# Patient Record
Sex: Female | Born: 1945
Health system: Southern US, Community
[De-identification: ages and names within clinical notes are randomized; demographics above are authoritative.]

## PROBLEM LIST (undated history)

## (undated) DIAGNOSIS — H3530 Unspecified macular degeneration: Secondary | ICD-10-CM

## (undated) DIAGNOSIS — M549 Dorsalgia, unspecified: Secondary | ICD-10-CM

## (undated) DIAGNOSIS — M109 Gout, unspecified: Secondary | ICD-10-CM

## (undated) DIAGNOSIS — H269 Unspecified cataract: Secondary | ICD-10-CM

## (undated) DIAGNOSIS — I10 Essential (primary) hypertension: Secondary | ICD-10-CM

## (undated) HISTORY — PX: EYE SURGERY: SHX253

## (undated) HISTORY — DX: Unspecified macular degeneration: H35.30

## (undated) HISTORY — DX: Unspecified cataract: H26.9

## (undated) HISTORY — PX: ABDOMINAL HYSTERECTOMY: SHX81

## (undated) HISTORY — PX: CATARACT EXTRACTION: SUR2

---

## 2010-10-28 ENCOUNTER — Emergency Department (INDEPENDENT_AMBULATORY_CARE_PROVIDER_SITE_OTHER): Payer: Self-pay

## 2010-10-28 ENCOUNTER — Emergency Department (HOSPITAL_BASED_OUTPATIENT_CLINIC_OR_DEPARTMENT_OTHER)
Admission: EM | Admit: 2010-10-28 | Discharge: 2010-10-28 | Disposition: A | Discharge status: Home / Self Care | Payer: Self-pay | Attending: Emergency Medicine | Admitting: Emergency Medicine

## 2010-10-28 DIAGNOSIS — M79609 Pain in unspecified limb: Secondary | ICD-10-CM

## 2010-10-28 DIAGNOSIS — I10 Essential (primary) hypertension: Secondary | ICD-10-CM | POA: Insufficient documentation

## 2010-10-28 DIAGNOSIS — I868 Varicose veins of other specified sites: Secondary | ICD-10-CM | POA: Insufficient documentation

## 2011-05-10 ENCOUNTER — Emergency Department (HOSPITAL_BASED_OUTPATIENT_CLINIC_OR_DEPARTMENT_OTHER)
Admission: EM | Admit: 2011-05-10 | Discharge: 2011-05-11 | Disposition: A | Discharge status: Home / Self Care | Payer: Medicare Other | Attending: Emergency Medicine | Admitting: Emergency Medicine

## 2011-05-10 ENCOUNTER — Encounter: Payer: Self-pay | Admitting: Emergency Medicine

## 2011-05-10 ENCOUNTER — Emergency Department (INDEPENDENT_AMBULATORY_CARE_PROVIDER_SITE_OTHER): Payer: Medicare Other

## 2011-05-10 DIAGNOSIS — M549 Dorsalgia, unspecified: Secondary | ICD-10-CM | POA: Insufficient documentation

## 2011-05-10 DIAGNOSIS — R109 Unspecified abdominal pain: Secondary | ICD-10-CM | POA: Insufficient documentation

## 2011-05-10 LAB — URINALYSIS, ROUTINE W REFLEX MICROSCOPIC
Glucose, UA: NEGATIVE mg/dL
Hgb urine dipstick: NEGATIVE
Specific Gravity, Urine: 1.025 (ref 1.005–1.030)
Urobilinogen, UA: 0.2 mg/dL (ref 0.0–1.0)

## 2011-05-10 LAB — URINE MICROSCOPIC-ADD ON

## 2011-05-10 MED ORDER — MORPHINE SULFATE 4 MG/ML IJ SOLN
6.0000 mg | Freq: Once | INTRAMUSCULAR | Status: AC
  Administered 2011-05-10: 4 mg via INTRAVENOUS
  Filled 2011-05-10: qty 1

## 2011-05-10 MED ORDER — MORPHINE SULFATE 2 MG/ML IJ SOLN
INTRAMUSCULAR | Status: AC
  Administered 2011-05-10: 2 mg via INTRAVENOUS
  Filled 2011-05-10: qty 1

## 2011-05-10 MED ORDER — OXYCODONE-ACETAMINOPHEN 5-325 MG PO TABS
2.0000 | ORAL_TABLET | Freq: Once | ORAL | Status: DC
  Filled 2011-05-10: qty 2

## 2011-05-10 MED ORDER — SODIUM CHLORIDE 0.9 % IV BOLUS (SEPSIS)
500.0000 mL | Freq: Once | INTRAVENOUS | Status: AC
  Administered 2011-05-10: 1000 mL via INTRAVENOUS

## 2011-05-10 NOTE — ED Notes (Signed)
Pt c/o lower back pain radiating to hips.

## 2011-05-10 NOTE — ED Provider Notes (Signed)
History     CSN: [id number] Arrival date & time: 05/10/2011 10:37 PM   First MD Initiated Contact with Patient 05/10/11 2300      Chief Complaint  Patient presents with  . Back Pain     The history is provided by the patient and the spouse.   is reports approximately 4 days of intermittent right flank pain with radiation down her right groin.  She denies nausea and vomiting.  She denies dysuria and urinary frequency.  She denies fevers or chills.  She's had no diarrhea.  She does report some mild pain radiating across to the left side of her back as well.  No prior history of kidney stones.  She has no looks really weakness.  She denies numbness and tingling.  Nothing worsens her symptoms.  Nothing improves her symptoms.  She tried ibuprofen without improvement in her symptoms.  Her pain is waxing and waning and at this severe when the pain comes.  Currently her pain is moderate  History reviewed. No pertinent past medical history.  Past Surgical History  Procedure Date  . Abdominal hysterectomy     No family history on file.  History  Substance Use Topics  . Smoking status: Never Smoker   . Smokeless tobacco: Not on file  . Alcohol Use: No    OB History    Grav Para Term Preterm Abortions TAB SAB Ect Mult Living                  Review of Systems  Musculoskeletal: Positive for back pain.  All other systems reviewed and are negative.    Allergies  Penicillins  Home Medications   Current Outpatient Rx  Name Route Sig Dispense Refill  . AMLODIPINE BESYLATE 5 MG PO TABS Oral Take 5 mg by mouth daily.      . IBUPROFEN 200 MG PO TABS Oral Take 400 mg by mouth every 6 (six) hours as needed. For pain     . ONE-DAILY MULTI VITAMINS PO TABS Oral Take 1 tablet by mouth daily.      Marland Kitchen PRESCRIPTION MEDICATION Oral Take 1 tablet by mouth daily as needed. Gout medication       BP 118/78  Pulse 70  Temp(Src) 98.1 F (36.7 C) (Oral)  Resp 18  SpO2 100%  Physical Exam    Nursing note and vitals reviewed. Constitutional: She is oriented to person, place, and time. She appears well-developed and well-nourished. No distress.  HENT:  Head: Normocephalic and atraumatic.  Eyes: EOM are normal.  Neck: Normal range of motion.  Cardiovascular: Normal rate, regular rhythm and normal heart sounds.   Pulmonary/Chest: Effort normal and breath sounds normal.  Abdominal: Soft. She exhibits no distension.       Mild left-sided abdominal tenderness without guarding or rebound.  No rash noted  Genitourinary:       Mild right CVA tenderness  Musculoskeletal: Normal range of motion.  Neurological: She is alert and oriented to person, place, and time.  Skin: Skin is warm and dry.  Psychiatric: She has a normal mood and affect. Judgment normal.    ED Course  Procedures (including critical care time)  Labs Reviewed  URINALYSIS, ROUTINE W REFLEX MICROSCOPIC - Abnormal; Notable for the following:    Appearance CLOUDY (*)    Leukocytes, UA SMALL (*)    All other components within normal limits  URINE MICROSCOPIC-ADD ON - Abnormal; Notable for the following:    Squamous Epithelial / LPF  FEW (*)    Bacteria, UA FEW (*)    All other components within normal limits  CBC - Abnormal; Notable for the following:    Hemoglobin 11.0 (*)    HCT 33.8 (*)    All other components within normal limits  COMPREHENSIVE METABOLIC PANEL - Abnormal; Notable for the following:    Glucose, Bld 106 (*)    Total Bilirubin 0.2 (*)    GFR calc non Af Amer 66 (*)    GFR calc Af Amer 76 (*)    All other components within normal limits   Ct Abdomen Pelvis Wo Contrast  05/11/2011  *RADIOLOGY REPORT*  Clinical Data: Right flank pain.  CT ABDOMEN AND PELVIS WITHOUT CONTRAST  Technique:  Multidetector CT imaging of the abdomen and pelvis was performed following the standard protocol without intravenous contrast.  Comparison: None.  Findings: Minimal bibasilar atelectasis is noted.  The liver and  spleen are unremarkable in appearance.  The gallbladder is within normal limits.  The pancreas and adrenal glands are unremarkable.  The kidneys are grossly unremarkable in appearance.  There is no evidence of hydronephrosis.  No renal or ureteral stones are seen. No perinephric stranding is appreciated.  No free fluid is identified.  The small bowel is unremarkable in appearance.  The stomach is filled with solid material and is within normal limits.  No acute vascular abnormalities are seen. Scattered calcification is noted along the abdominal aorta and its branches.  The appendix is not definitely seen; there is no evidence for appendicitis.  The colon is unremarkable in appearance.  The bladder is decompressed and grossly unremarkable in appearance, though there is prominence of the urethra.  The patient is status post hysterectomy; no suspicious adnexal masses are seen.  No inguinal lymphadenopathy is seen.  No acute osseous abnormalities are identified.  IMPRESSION:  1.  No acute abnormalities identified within the abdomen or pelvis. 2.  Scattered calcification along the abdominal aorta and its branches.  Original Report Authenticated By: Tonia Ghent, M.D.     1. Back pain       MDM  Consideration for right ureteral stone.  She has no lower extremity weakness.  We'll obtain LFTs CBC urinalysis.  CT scan of her abdomen and pelvis pending at this time.  IV fluids and pain medicine.  12:57 AM The patient feels much better at this time. Will dc home with pain medicine. May represent musculoskeletal pain. Urine culture sent. No urinary symptoms       Lyanne Co, MD 05/11/11 0100

## 2011-05-11 DIAGNOSIS — R109 Unspecified abdominal pain: Secondary | ICD-10-CM

## 2011-05-11 LAB — CBC
MCV: 82.6 fL (ref 78.0–100.0)
Platelets: 242 10*3/uL (ref 150–400)
RDW: 14.6 % (ref 11.5–15.5)
WBC: 6.5 10*3/uL (ref 4.0–10.5)

## 2011-05-11 LAB — COMPREHENSIVE METABOLIC PANEL
AST: 14 U/L (ref 0–37)
Albumin: 3.8 g/dL (ref 3.5–5.2)
Chloride: 107 mEq/L (ref 96–112)
Creatinine, Ser: 0.9 mg/dL (ref 0.50–1.10)
Total Bilirubin: 0.2 mg/dL — ABNORMAL LOW (ref 0.3–1.2)
Total Protein: 7.7 g/dL (ref 6.0–8.3)

## 2011-05-11 MED ORDER — OXYCODONE-ACETAMINOPHEN 5-325 MG PO TABS: 1.0000 | ORAL_TABLET | ORAL | Status: AC | PRN

## 2012-03-26 ENCOUNTER — Encounter (HOSPITAL_BASED_OUTPATIENT_CLINIC_OR_DEPARTMENT_OTHER): Payer: Self-pay | Admitting: Emergency Medicine

## 2012-03-26 ENCOUNTER — Emergency Department (HOSPITAL_BASED_OUTPATIENT_CLINIC_OR_DEPARTMENT_OTHER): Payer: Medicare Other

## 2012-03-26 ENCOUNTER — Emergency Department (HOSPITAL_BASED_OUTPATIENT_CLINIC_OR_DEPARTMENT_OTHER)
Admission: EM | Admit: 2012-03-26 | Discharge: 2012-03-27 | Disposition: A | Discharge status: Home / Self Care | Payer: Medicare Other | Attending: Emergency Medicine | Admitting: Emergency Medicine

## 2012-03-26 DIAGNOSIS — M25562 Pain in left knee: Secondary | ICD-10-CM

## 2012-03-26 DIAGNOSIS — Z880 Allergy status to penicillin: Secondary | ICD-10-CM | POA: Insufficient documentation

## 2012-03-26 DIAGNOSIS — M79632 Pain in left forearm: Secondary | ICD-10-CM

## 2012-03-26 DIAGNOSIS — M109 Gout, unspecified: Secondary | ICD-10-CM | POA: Insufficient documentation

## 2012-03-26 DIAGNOSIS — M25539 Pain in unspecified wrist: Secondary | ICD-10-CM | POA: Insufficient documentation

## 2012-03-26 DIAGNOSIS — I10 Essential (primary) hypertension: Secondary | ICD-10-CM | POA: Insufficient documentation

## 2012-03-26 DIAGNOSIS — M25569 Pain in unspecified knee: Secondary | ICD-10-CM | POA: Insufficient documentation

## 2012-03-26 HISTORY — DX: Gout, unspecified: M10.9

## 2012-03-26 HISTORY — DX: Essential (primary) hypertension: I10

## 2012-03-26 MED ORDER — NAPROXEN 250 MG PO TABS
500.0000 mg | ORAL_TABLET | Freq: Once | ORAL | Status: AC
  Administered 2012-03-27: 500 mg via ORAL
  Filled 2012-03-26: qty 2

## 2012-03-26 NOTE — ED Notes (Signed)
Pt sts sharp pain in left arm,knee and leg. Has seen PCP at [facility] for same 6 weeks ago was told to follow up with x rays but did not

## 2012-03-26 NOTE — ED Provider Notes (Signed)
History  This chart was scribed for Hanley Seamen, MD by Erskine Emery. This patient was seen in room MH03/MH03 and the patient's care was started at 23:13.   CSN: [id number]  Arrival date & time 03/26/12  2222   First MD Initiated Contact with Patient 03/26/12 2313      Chief Complaint  Patient presents with  . Extremity Pain    (Consider location/radiation/quality/duration/timing/severity/associated sxs/prior treatment) The history is provided by the patient. No language interpreter was used.  Kerry Barker is a 66 y.o. female who presents to the Emergency Department complaining of left leg and knee pain for the past 6 weeks and left arm pain since this morning. Pt reports a h/o gout that usually flares up in her feet, for which she takes colchicine. Pt saw her PCP with the same complaint; she did not do an x-ray and suggested the pt just continue to take her gout medication, with which the pt has experienced no relief from symptoms.   Past Medical History  Diagnosis Date  . Gout   . Hypertension     Past Surgical History  Procedure Date  . Abdominal hysterectomy     No family history on file.  History  Substance Use Topics  . Smoking status: Never Smoker   . Smokeless tobacco: Not on file  . Alcohol Use: No    OB History    Grav Para Term Preterm Abortions TAB SAB Ect Mult Living                  Review of Systems A complete 10 system review of systems was obtained and all systems are negative except as noted in the HPI and PMH.    Allergies  Penicillins  Home Medications   Current Outpatient Rx  Name Route Sig Dispense Refill  . COLCHICINE 0.6 MG PO TABS Oral Take 0.6 mg by mouth daily.    Marland Kitchen AMLODIPINE BESYLATE 5 MG PO TABS Oral Take 5 mg by mouth daily.      . IBUPROFEN 200 MG PO TABS Oral Take 400 mg by mouth every 6 (six) hours as needed. For pain     . ONE-DAILY MULTI VITAMINS PO TABS Oral Take 1 tablet by mouth daily.      Marland Kitchen PRESCRIPTION MEDICATION  Oral Take 1 tablet by mouth daily as needed. Gout medication       Triage Vitals: BP 133/89  Pulse 84  Temp 98.2 F (36.8 C) (Oral)  Resp 20  Ht 5\' 6"  (1.676 m)  Wt 207 lb (93.895 kg)  BMI 33.41 kg/m2  SpO2 100%  Physical Exam  Nursing note and vitals reviewed. Constitutional: She is oriented to person, place, and time. She appears well-developed and well-nourished. No distress.  HENT:  Head: Normocephalic and atraumatic.  Eyes: EOM are normal. Pupils are equal, round, and reactive to light.       Arcus senilis  Neck: Neck supple. No tracheal deviation present.  Cardiovascular: Normal rate, regular rhythm and normal heart sounds.   Pulmonary/Chest: Effort normal and breath sounds normal. No respiratory distress.  Abdominal: Soft. She exhibits no distension.  Musculoskeletal: Normal range of motion. She exhibits no edema.       Tender to knee, especially to percussion of patella.  Neurological: She is alert and oriented to person, place, and time.  Skin: Skin is warm and dry.       Left forearm soft tissue tenderness without swelling, warmth, or erythema.  Psychiatric: She  has a normal mood and affect.  Addendum: negative left Tinel's test; no erythema, warmth or swelling of left knee  ED Course  Procedures (including critical care time) DIAGNOSTIC STUDIES: Oxygen Saturation is 100% on room air, normal by my interpretation.    COORDINATION OF CARE: 23:15--I evaluated the patient and we discussed a treatment plan including knee x-ray to which the pt agreed.     MDM  Dg Knee Complete 4 Views Left  03/26/2012  *RADIOLOGY REPORT*  Clinical Data: Left knee pain for 6 weeks.  LEFT KNEE - COMPLETE 4+ VIEW  Comparison: None.  Findings: Somewhat high-riding patella which may represent patella alta.  Irregularity of the patellar surface suggest osteochondral lesion which may represent chondromalacia patella.  Consider MRI for further evaluation.  Small left knee effusion.  Mild  degenerative changes in the medial and lateral compartments.  No evidence of acute fracture.  No focal bone lesion or bone destruction is otherwise appreciated.  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  Mild degenerative changes.  No acute fractures appreciated.   Original Report Authenticated By: Marlon Pel, M.D.        I personally performed the services described in this documentation, which was scribed in my presence.  The recorded information has been reviewed and considered.    Hanley Seamen, MD 03/26/12 [phone]

## 2012-03-26 NOTE — ED Notes (Signed)
Patient reports left leg pain for approximately 6 weeks and today started having pain in left arm.  Was seen by MD for leg pain and was tx'd for gout.  She states no relief.

## 2012-03-27 MED ORDER — NAPROXEN SODIUM 275 MG PO TABS: 275.0000 mg | ORAL_TABLET | Freq: Two times a day (BID) | ORAL | Status: DC

## 2012-03-27 MED ORDER — HYDROCODONE-ACETAMINOPHEN 5-325 MG PO TABS: 1.0000 | ORAL_TABLET | Freq: Four times a day (QID) | ORAL | Status: DC | PRN

## 2012-05-02 ENCOUNTER — Encounter (HOSPITAL_BASED_OUTPATIENT_CLINIC_OR_DEPARTMENT_OTHER): Payer: Self-pay | Admitting: Emergency Medicine

## 2012-05-02 ENCOUNTER — Emergency Department (HOSPITAL_BASED_OUTPATIENT_CLINIC_OR_DEPARTMENT_OTHER)
Admission: EM | Admit: 2012-05-02 | Discharge: 2012-05-02 | Disposition: A | Discharge status: Home / Self Care | Payer: Medicare Other | Attending: Emergency Medicine | Admitting: Emergency Medicine

## 2012-05-02 ENCOUNTER — Emergency Department (HOSPITAL_BASED_OUTPATIENT_CLINIC_OR_DEPARTMENT_OTHER): Payer: Medicare Other

## 2012-05-02 DIAGNOSIS — Z79899 Other long term (current) drug therapy: Secondary | ICD-10-CM | POA: Insufficient documentation

## 2012-05-02 DIAGNOSIS — Y939 Activity, unspecified: Secondary | ICD-10-CM | POA: Insufficient documentation

## 2012-05-02 DIAGNOSIS — I10 Essential (primary) hypertension: Secondary | ICD-10-CM | POA: Insufficient documentation

## 2012-05-02 DIAGNOSIS — M109 Gout, unspecified: Secondary | ICD-10-CM | POA: Insufficient documentation

## 2012-05-02 DIAGNOSIS — W2203XA Walked into furniture, initial encounter: Secondary | ICD-10-CM | POA: Insufficient documentation

## 2012-05-02 DIAGNOSIS — Y92009 Unspecified place in unspecified non-institutional (private) residence as the place of occurrence of the external cause: Secondary | ICD-10-CM | POA: Insufficient documentation

## 2012-05-02 DIAGNOSIS — S92919A Unspecified fracture of unspecified toe(s), initial encounter for closed fracture: Secondary | ICD-10-CM | POA: Insufficient documentation

## 2012-05-02 MED ORDER — HYDROCODONE-ACETAMINOPHEN 5-325 MG PO TABS
1.0000 | ORAL_TABLET | Freq: Once | ORAL | Status: AC
  Administered 2012-05-02: 1 via ORAL
  Filled 2012-05-02: qty 1

## 2012-05-02 MED ORDER — HYDROCODONE-ACETAMINOPHEN 5-325 MG PO TABS: 1.0000 | ORAL_TABLET | Freq: Four times a day (QID) | ORAL | Status: DC | PRN

## 2012-05-02 NOTE — ED Provider Notes (Signed)
History     CSN: [id number]  Arrival date & time 05/02/12  2230   None     Chief Complaint  Patient presents with  . Toe Injury    (Consider location/radiation/quality/duration/timing/severity/associated sxs/prior treatment) HPI This is a [age]-year-old female who stubbed her right fourth toe on a piece of furniture at home earlier this evening. There is moderate pain in the right fourth toe, worse with palpation or movement. The toe has a deformity. She denies other injury. There is no numbness.  Past Medical History  Diagnosis Date  . Gout   . Hypertension     Past Surgical History  Procedure Date  . Abdominal hysterectomy     No family history on file.  History  Substance Use Topics  . Smoking status: Never Smoker   . Smokeless tobacco: Not on file  . Alcohol Use: No    OB History    Grav Para Term Preterm Abortions TAB SAB Ect Mult Living                  Review of Systems  All other systems reviewed and are negative.    Allergies  Penicillins and Tramadol  Home Medications   Current Outpatient Rx  Name  Route  Sig  Dispense  Refill  . AMLODIPINE BESYLATE 5 MG PO TABS   Oral   Take 5 mg by mouth daily.           . COLCHICINE 0.6 MG PO TABS   Oral   Take 0.6 mg by mouth daily.         Marland Kitchen HYDROCODONE-ACETAMINOPHEN 5-325 MG PO TABS   Oral   Take 1 tablet by mouth every 6 (six) hours as needed for pain.   20 tablet   0   . HYDROCODONE-ACETAMINOPHEN 5-325 MG PO TABS   Oral   Take 1-2 tablets by mouth every 6 (six) hours as needed for pain.   20 tablet   0   . ONE-DAILY MULTI VITAMINS PO TABS   Oral   Take 1 tablet by mouth daily.           Marland Kitchen NAPROXEN SODIUM 275 MG PO TABS   Oral   Take 1 tablet (275 mg total) by mouth 2 (two) times daily with a meal.   30 tablet   0   . PRESCRIPTION MEDICATION   Oral   Take 1 tablet by mouth daily as needed. Gout medication            BP 122/81  Pulse 78  Temp 98.3 F (36.8 C) (Oral)   Resp 20  Ht 5\' 6"  (1.676 m)  Wt 210 lb (95.255 kg)  BMI 33.89 kg/m2  SpO2 100%  Physical Exam General: Well-developed, well-nourished female in no acute distress; appearance consistent with age of record HENT: normocephalic, atraumatic Eyes: pupils equal round and reactive to light; extraocular muscles intact; arcus senilis bilaterally Neck: supple Heart: regular rate and rhythm Lungs: clear to auscultation bilaterally Abdomen: soft; nondistended Extremities: Varus deformity of right fourth toe with tenderness; toe neurovascularly intact Neurologic: Awake, alert and oriented; motor function intact in all extremities and symmetric; no facial droop Skin: Warm and dry     ED Course  Procedures (including critical care time)  CLOSED REDUCTION The patient's right fourth toe fracture was reduced with gentle traction. The patient tolerated this well and there were no immediate complications. The toe was then buddy taped to the right third toe and  the patient was placed in a postop shoe.   MDM  Dg Toe 4th Right  05/02/2012  *RADIOLOGY REPORT*  Clinical Data: Pain at right fourth toe, hit on chair  RIGHT FOURTH TOE  Comparison: None  Findings: Oblique mildly displaced fracture of the distal aspect of the proximal phalanx right fourth toe. No articular extension. Minimal apex medial angulation of distal fragment. Joint spaces preserved. No additional fracture or dislocation seen.  IMPRESSION: Minimally displaced angulated fracture at the proximal phalanx of the right fourth toe.   Original Report Authenticated By: Ulyses Southward, M.D.            Hanley Seamen, MD 05/02/12 2340

## 2012-05-02 NOTE — ED Notes (Signed)
Pt bumped right 4th toe on furniture at home.  Swelling and deformity.

## 2012-09-18 ENCOUNTER — Emergency Department (HOSPITAL_BASED_OUTPATIENT_CLINIC_OR_DEPARTMENT_OTHER): Payer: Medicare Other

## 2012-09-18 ENCOUNTER — Emergency Department (HOSPITAL_BASED_OUTPATIENT_CLINIC_OR_DEPARTMENT_OTHER)
Admission: EM | Admit: 2012-09-18 | Discharge: 2012-09-18 | Disposition: A | Discharge status: Home / Self Care | Payer: Medicare Other | Attending: Emergency Medicine | Admitting: Emergency Medicine

## 2012-09-18 ENCOUNTER — Encounter (HOSPITAL_BASED_OUTPATIENT_CLINIC_OR_DEPARTMENT_OTHER): Payer: Self-pay

## 2012-09-18 DIAGNOSIS — R109 Unspecified abdominal pain: Secondary | ICD-10-CM | POA: Insufficient documentation

## 2012-09-18 DIAGNOSIS — Z90710 Acquired absence of both cervix and uterus: Secondary | ICD-10-CM | POA: Insufficient documentation

## 2012-09-18 DIAGNOSIS — R1030 Lower abdominal pain, unspecified: Secondary | ICD-10-CM

## 2012-09-18 DIAGNOSIS — N8111 Cystocele, midline: Secondary | ICD-10-CM | POA: Insufficient documentation

## 2012-09-18 DIAGNOSIS — M109 Gout, unspecified: Secondary | ICD-10-CM | POA: Insufficient documentation

## 2012-09-18 DIAGNOSIS — N949 Unspecified condition associated with female genital organs and menstrual cycle: Secondary | ICD-10-CM | POA: Insufficient documentation

## 2012-09-18 DIAGNOSIS — M545 Low back pain, unspecified: Secondary | ICD-10-CM | POA: Insufficient documentation

## 2012-09-18 DIAGNOSIS — Z79899 Other long term (current) drug therapy: Secondary | ICD-10-CM | POA: Insufficient documentation

## 2012-09-18 DIAGNOSIS — I10 Essential (primary) hypertension: Secondary | ICD-10-CM | POA: Insufficient documentation

## 2012-09-18 HISTORY — DX: Dorsalgia, unspecified: M54.9

## 2012-09-18 LAB — CBC WITH DIFFERENTIAL/PLATELET
Eosinophils Relative: 1 % (ref 0–5)
HCT: 35.8 % — ABNORMAL LOW (ref 36.0–46.0)
Hemoglobin: 11.8 g/dL — ABNORMAL LOW (ref 12.0–15.0)
Lymphocytes Relative: 48 % — ABNORMAL HIGH (ref 12–46)
Lymphs Abs: 2.6 10*3/uL (ref 0.7–4.0)
MCV: 83.3 fL (ref 78.0–100.0)
Monocytes Absolute: 0.5 10*3/uL (ref 0.1–1.0)
Monocytes Relative: 9 % (ref 3–12)
Neutro Abs: 2.3 10*3/uL (ref 1.7–7.7)
RBC: 4.3 MIL/uL (ref 3.87–5.11)
WBC: 5.5 10*3/uL (ref 4.0–10.5)

## 2012-09-18 LAB — URINALYSIS, ROUTINE W REFLEX MICROSCOPIC
Glucose, UA: NEGATIVE mg/dL
Hgb urine dipstick: NEGATIVE
Protein, ur: NEGATIVE mg/dL
Specific Gravity, Urine: 1.022 (ref 1.005–1.030)
pH: 5.5 (ref 5.0–8.0)

## 2012-09-18 LAB — BASIC METABOLIC PANEL
CO2: 26 mEq/L (ref 19–32)
Calcium: 9.5 mg/dL (ref 8.4–10.5)
Chloride: 105 mEq/L (ref 96–112)
Creatinine, Ser: 1 mg/dL (ref 0.50–1.10)
Glucose, Bld: 96 mg/dL (ref 70–99)

## 2012-09-18 MED ORDER — SODIUM CHLORIDE 0.9 % IV BOLUS (SEPSIS)
500.0000 mL | Freq: Once | INTRAVENOUS | Status: AC
  Administered 2012-09-18: 500 mL via INTRAVENOUS

## 2012-09-18 MED ORDER — KETOROLAC TROMETHAMINE 30 MG/ML IJ SOLN
30.0000 mg | Freq: Once | INTRAMUSCULAR | Status: AC
  Administered 2012-09-18: 30 mg via INTRAVENOUS
  Filled 2012-09-18: qty 1

## 2012-09-18 NOTE — ED Notes (Signed)
C/o pelvic pain, right flank pain x 1 week

## 2012-09-18 NOTE — ED Provider Notes (Signed)
History     CSN: [id number]  Arrival date & time 09/18/12  8657   First MD Initiated Contact with Patient 09/18/12 1954      Chief Complaint  Patient presents with  . Pelvic Pain  . Flank Pain    (Consider location/radiation/quality/duration/timing/severity/associated sxs/prior treatment) HPI 67 y.o. Female complaining of suprapubic and left flank and low back pain.  Patient seen by nurse after going to bathroom and noted to have mass from vagina which nurse reduced with pressure.  Patient has noted this with pushing.  She states she has had pain for several days.  The pain worsens with urinating and certain movements.  No fever, chills, nausea, vomiting.  Patient has history of chronic constipation.   Past Medical History  Diagnosis Date  . Gout   . Hypertension   . Back pain     Past Surgical History  Procedure Laterality Date  . Abdominal hysterectomy      No family history on file.  History  Substance Use Topics  . Smoking status: Never Smoker   . Smokeless tobacco: Not on file  . Alcohol Use: No    OB History   Grav Para Term Preterm Abortions TAB SAB Ect Mult Living                  Review of Systems  All other systems reviewed and are negative.    Allergies  Penicillins and Tramadol  Home Medications   Current Outpatient Rx  Name  Route  Sig  Dispense  Refill  . amLODipine (NORVASC) 5 MG tablet   Oral   Take 5 mg by mouth daily.           . colchicine 0.6 MG tablet   Oral   Take 0.6 mg by mouth daily.         Marland Kitchen HYDROcodone-acetaminophen (NORCO/VICODIN) 5-325 MG per tablet   Oral   Take 1 tablet by mouth every 6 (six) hours as needed for pain.   20 tablet   0   . HYDROcodone-acetaminophen (NORCO/VICODIN) 5-325 MG per tablet   Oral   Take 1-2 tablets by mouth every 6 (six) hours as needed for pain.   20 tablet   0   . Multiple Vitamin (MULTIVITAMIN) tablet   Oral   Take 1 tablet by mouth daily.           . naproxen sodium  (ANAPROX) 275 MG tablet   Oral   Take 1 tablet (275 mg total) by mouth 2 (two) times daily with a meal.   30 tablet   0   . PRESCRIPTION MEDICATION   Oral   Take 1 tablet by mouth daily as needed. Gout medication            BP 133/82  Pulse 83  Temp(Src) 98.7 F (37.1 C) (Oral)  Resp 12  Ht 5\' 6"  (1.676 m)  Wt 203 lb (92.08 kg)  BMI 32.78 kg/m2  SpO2 100%  Physical Exam  Nursing note and vitals reviewed. Constitutional: She is oriented to person, place, and time. She appears well-developed and well-nourished.  HENT:  Head: Normocephalic and atraumatic.  Right Ear: External ear normal.  Left Ear: External ear normal.  Nose: Nose normal.  Mouth/Throat: Oropharynx is clear and moist.  Eyes: Conjunctivae are normal. Pupils are equal, round, and reactive to light.  Neck: Normal range of motion. Neck supple.  Cardiovascular: Normal rate, regular rhythm, normal heart sounds and intact distal pulses.  Pulmonary/Chest: Effort normal and breath sounds normal.  Abdominal: Soft. Bowel sounds are normal.  Musculoskeletal: Normal range of motion. She exhibits no edema and no tenderness.  Neurological: She is alert and oriented to person, place, and time.  Skin: Skin is warm and dry.  Psychiatric: She has a normal mood and affect. Her behavior is normal. Judgment and thought content normal.    ED Course  Procedures (including critical care time)  Labs Reviewed  CBC WITH DIFFERENTIAL - Abnormal; Notable for the following:    Hemoglobin 11.8 (*)    HCT 35.8 (*)    Neutrophils Relative 41 (*)    Lymphocytes Relative 48 (*)    All other components within normal limits  BASIC METABOLIC PANEL - Abnormal; Notable for the following:    Potassium 3.3 (*)    GFR calc non Af Amer 57 (*)    GFR calc Af Amer 67 (*)    All other components within normal limits  URINALYSIS, ROUTINE W REFLEX MICROSCOPIC   Ct Abdomen Pelvis Wo Contrast  09/18/2012  *RADIOLOGY REPORT*  Clinical Data:  Right flank pain and pelvic pain for 1 week, status post hysterectomy.  CT ABDOMEN AND PELVIS WITHOUT CONTRAST  Technique:  Multidetector CT imaging of the abdomen and pelvis was performed following the standard protocol without intravenous contrast.  Comparison: 05/11/2011 CT abdomen pelvis  Findings: The lung bases are clear, aside from dependent atelectasis in the right lower lobe.  Negative for pleural pericardial effusion.  Negative for urinary tract stones or obstruction.  The noncontrast appearance of the liver, gallbladder, spleen, adrenal glands, kidneys, and pancreas is within normal limits.  Negative for biliary ductal dilatation.  Stomach is moderately distended with food particles.  Small bowel loops and colon are normal in caliber. There are is no ascites or lymphadenopathy.  The abdominal aorta is normal in caliber and there is scattered atherosclerotic calcification.  No acute osseous abnormality.  IMPRESSION:  1.  No acute abnormalities are identified in the abdomen pelvis. 2.  Negative for urinary tract stones or obstruction.   Original Report Authenticated By: Britta Mccreedy, M.D.      No diagnosis found. Results for orders placed during the hospital encounter of 09/18/12  URINALYSIS, ROUTINE W REFLEX MICROSCOPIC      Result Value Range   Color, Urine YELLOW  YELLOW   APPearance CLEAR  CLEAR   Specific Gravity, Urine 1.022  1.005 - 1.030   pH 5.5  5.0 - 8.0   Glucose, UA NEGATIVE  NEGATIVE mg/dL   Hgb urine dipstick NEGATIVE  NEGATIVE   Bilirubin Urine NEGATIVE  NEGATIVE   Ketones, ur NEGATIVE  NEGATIVE mg/dL   Protein, ur NEGATIVE  NEGATIVE mg/dL   Urobilinogen, UA 0.2  0.0 - 1.0 mg/dL   Nitrite NEGATIVE  NEGATIVE   Leukocytes, UA NEGATIVE  NEGATIVE  CBC WITH DIFFERENTIAL      Result Value Range   WBC 5.5  4.0 - 10.5 K/uL   RBC 4.30  3.87 - 5.11 MIL/uL   Hemoglobin 11.8 (*) 12.0 - 15.0 g/dL   HCT 16.1 (*) 09.6 - 04.5 %   MCV 83.3  78.0 - 100.0 fL   MCH 27.4  26.0 - 34.0  pg   MCHC 33.0  30.0 - 36.0 g/dL   RDW 40.9  81.1 - 91.4 %   Platelets 228  150 - 400 K/uL   Neutrophils Relative 41 (*) 43 - 77 %   Neutro Abs 2.3  1.7 -  7.7 K/uL   Lymphocytes Relative 48 (*) 12 - 46 %   Lymphs Abs 2.6  0.7 - 4.0 K/uL   Monocytes Relative 9  3 - 12 %   Monocytes Absolute 0.5  0.1 - 1.0 K/uL   Eosinophils Relative 1  0 - 5 %   Eosinophils Absolute 0.1  0.0 - 0.7 K/uL   Basophils Relative 0  0 - 1 %   Basophils Absolute 0.0  0.0 - 0.1 K/uL  BASIC METABOLIC PANEL      Result Value Range   Sodium 142  135 - 145 mEq/L   Potassium 3.3 (*) 3.5 - 5.1 mEq/L   Chloride 105  96 - 112 mEq/L   CO2 26  19 - 32 mEq/L   Glucose, Bld 96  70 - 99 mg/dL   BUN 18  6 - 23 mg/dL   Creatinine, Ser 1.61  0.50 - 1.10 mg/dL   Calcium 9.5  8.4 - 09.6 mg/dL   GFR calc non Af Amer 57 (*) >90 mL/min   GFR calc Af Amer 67 (*) >90 mL/min      MDM  Patient with clinical description of prolapsed bladder by nurse and patient.  Reduced on my exam.  NO uti or obvious source of pain.  She is slightly tender in suprapubic area and on bimanual over bladder.    Pain improved with toradol.  Plan pain control and follow up with urology.       Hilario Quarry, MD 09/18/12 [phone]

## 2012-09-18 NOTE — ED Notes (Signed)
MD at bedside giving test results and plan of care for DC. 

## 2013-02-20 ENCOUNTER — Emergency Department (HOSPITAL_BASED_OUTPATIENT_CLINIC_OR_DEPARTMENT_OTHER)
Admission: EM | Admit: 2013-02-20 | Discharge: 2013-02-21 | Disposition: A | Discharge status: Home / Self Care | Payer: Medicare Other | Attending: Emergency Medicine | Admitting: Emergency Medicine

## 2013-02-20 ENCOUNTER — Encounter (HOSPITAL_BASED_OUTPATIENT_CLINIC_OR_DEPARTMENT_OTHER): Payer: Self-pay | Admitting: Emergency Medicine

## 2013-02-20 DIAGNOSIS — Z791 Long term (current) use of non-steroidal anti-inflammatories (NSAID): Secondary | ICD-10-CM | POA: Insufficient documentation

## 2013-02-20 DIAGNOSIS — Y929 Unspecified place or not applicable: Secondary | ICD-10-CM | POA: Insufficient documentation

## 2013-02-20 DIAGNOSIS — Z79899 Other long term (current) drug therapy: Secondary | ICD-10-CM | POA: Insufficient documentation

## 2013-02-20 DIAGNOSIS — I10 Essential (primary) hypertension: Secondary | ICD-10-CM | POA: Insufficient documentation

## 2013-02-20 DIAGNOSIS — Z880 Allergy status to penicillin: Secondary | ICD-10-CM | POA: Insufficient documentation

## 2013-02-20 DIAGNOSIS — S39012A Strain of muscle, fascia and tendon of lower back, initial encounter: Secondary | ICD-10-CM

## 2013-02-20 DIAGNOSIS — M109 Gout, unspecified: Secondary | ICD-10-CM | POA: Insufficient documentation

## 2013-02-20 DIAGNOSIS — X58XXXA Exposure to other specified factors, initial encounter: Secondary | ICD-10-CM | POA: Insufficient documentation

## 2013-02-20 DIAGNOSIS — N390 Urinary tract infection, site not specified: Secondary | ICD-10-CM | POA: Insufficient documentation

## 2013-02-20 DIAGNOSIS — S335XXA Sprain of ligaments of lumbar spine, initial encounter: Secondary | ICD-10-CM | POA: Insufficient documentation

## 2013-02-20 DIAGNOSIS — Y939 Activity, unspecified: Secondary | ICD-10-CM | POA: Insufficient documentation

## 2013-02-20 LAB — URINALYSIS, ROUTINE W REFLEX MICROSCOPIC
Glucose, UA: NEGATIVE mg/dL
Hgb urine dipstick: NEGATIVE
Ketones, ur: NEGATIVE mg/dL
Protein, ur: NEGATIVE mg/dL
pH: 5 (ref 5.0–8.0)

## 2013-02-20 NOTE — ED Provider Notes (Signed)
CSN: [id number]     Arrival date & time 02/20/13  2255 History  This chart was scribed for Anajulia Leyendecker Smitty Cords, MD by Blanchard Kelch, ED Scribe. The patient was seen in room MH09/MH09. Patient's care was started at 11:49 PM.    Chief Complaint  Patient presents with  . Back Pain  . Urinary Tract Infection    Patient is a 67 y.o. female presenting with back pain. The history is provided by the patient. No language interpreter was used.  Back Pain Location:  Lumbar spine Quality:  Aching Radiates to:  Does not radiate Pain severity:  Moderate Pain is:  Same all the time Onset quality:  Sudden Duration:  2 weeks Timing:  Constant Progression:  Unchanged Chronicity:  New Context: not falling   Relieved by:  Nothing Worsened by:  Nothing tried Ineffective treatments:  None tried (tylenol) Associated symptoms: no bladder incontinence, no bowel incontinence, no dysuria, no fever, no leg pain, no numbness, no paresthesias, no pelvic pain, no perianal numbness, no tingling and no weakness   Risk factors: no steroid use     HPI Comments: Kerry Barker is a 67 y.o. female who presents to the Emergency Department complaining of constant, unchanged back pain that began two weeks ago. She was told she had a UTI when seen by her PMD and started on antibiotics.  She has been taking Tylenol for the pain without relief.    Her PCP is at [facility] in [place]    Past Medical History  Diagnosis Date  . Gout   . Hypertension   . Back pain    Past Surgical History  Procedure Laterality Date  . Abdominal hysterectomy     No family history on file. History  Substance Use Topics  . Smoking status: Never Smoker   . Smokeless tobacco: Not on file  . Alcohol Use: No   OB History   Grav Para Term Preterm Abortions TAB SAB Ect Mult Living                 Review of Systems  Constitutional: Negative for fever.  Gastrointestinal: Negative for bowel incontinence.  Genitourinary:  Negative for bladder incontinence, dysuria and pelvic pain.  Musculoskeletal: Positive for back pain.  Neurological: Negative for tingling, weakness, numbness and paresthesias.  All other systems reviewed and are negative.    Allergies  Penicillins and Tramadol  Home Medications   Current Outpatient Rx  Name  Route  Sig  Dispense  Refill  . amLODipine (NORVASC) 5 MG tablet   Oral   Take 5 mg by mouth daily.           . colchicine 0.6 MG tablet   Oral   Take 0.6 mg by mouth as needed.          . doxycycline (VIBRAMYCIN) 100 MG capsule   Oral   Take 100 mg by mouth 2 (two) times daily.         . Multiple Vitamin (MULTIVITAMIN) tablet   Oral   Take 1 tablet by mouth daily.           Marland Kitchen HYDROcodone-acetaminophen (NORCO/VICODIN) 5-325 MG per tablet   Oral   Take 1 tablet by mouth every 6 (six) hours as needed for pain.   20 tablet   0   . HYDROcodone-acetaminophen (NORCO/VICODIN) 5-325 MG per tablet   Oral   Take 1-2 tablets by mouth every 6 (six) hours as needed for pain.   20  tablet   0   . naproxen sodium (ANAPROX) 275 MG tablet   Oral   Take 1 tablet (275 mg total) by mouth 2 (two) times daily with a meal.   30 tablet   0   . PRESCRIPTION MEDICATION   Oral   Take 1 tablet by mouth daily as needed. Gout medication           Triage Vitals: BP 149/91  Pulse 78  Temp(Src) 99 F (37.2 C) (Oral)  Resp 18  Ht 5\' 6"  (1.676 m)  Wt 210 lb (95.255 kg)  BMI 33.91 kg/m2  SpO2 100%  Physical Exam  Nursing note and vitals reviewed. Constitutional: She is oriented to person, place, and time. She appears well-developed and well-nourished. No distress.  HENT:  Head: Normocephalic and atraumatic.  Mouth/Throat: Oropharynx is clear and moist. No oropharyngeal exudate.  Eyes: Pupils are equal, round, and reactive to light.  Neck: Normal range of motion. Neck supple.  Cardiovascular: Normal rate, regular rhythm and intact distal pulses.   Intact peripheral  pulses  Pulmonary/Chest: Effort normal and breath sounds normal. She has no wheezes. She has no rales.  Abdominal: Soft. Bowel sounds are normal. There is no tenderness. There is no rebound and no guarding.  Genitourinary:  Pelvis stable  Musculoskeletal: Normal range of motion. She exhibits no edema.  Neurological: She is alert and oriented to person, place, and time. She has normal reflexes.  Skin: Skin is warm.  Psychiatric: She has a normal mood and affect.    ED Course  Procedures (including critical care time)  DIAGNOSTIC STUDIES: Oxygen Saturation is 100% on room air, normal by my interpretation.    COORDINATION OF CARE:  11:53 PM - Patient verbalizes understanding and agrees with treatment plan.    Labs Review Labs Reviewed  URINALYSIS, ROUTINE W REFLEX MICROSCOPIC   Imaging Review No results found.  MDM  No diagnosis found. Lumbar strain, will treat with NSAIDS follow up with your regular doctor for ongoing care  I personally performed the services described in this documentation, which was scribed in my presence. The recorded information has been reviewed and is accurate.    Jasmine Awe, MD 02/21/13 [phone]

## 2013-02-20 NOTE — ED Notes (Signed)
MD at bedside. 

## 2013-02-20 NOTE — ED Notes (Signed)
Pt also reports urinary frequency. 

## 2013-02-20 NOTE — ED Notes (Signed)
Pt c/o lower back pain x 2 weeks ago. Pt was seen by PMD and dx with UTI. Pt has only 1 dose of abx left to take with no improvement in pain.

## 2013-02-21 ENCOUNTER — Emergency Department (HOSPITAL_BASED_OUTPATIENT_CLINIC_OR_DEPARTMENT_OTHER): Payer: Medicare Other

## 2013-02-21 MED ORDER — IBUPROFEN 800 MG PO TABS
800.0000 mg | ORAL_TABLET | Freq: Once | ORAL | Status: AC
  Administered 2013-02-21: 800 mg via ORAL
  Filled 2013-02-21: qty 1

## 2013-02-21 MED ORDER — NAPROXEN 500 MG PO TABS: 500.0000 mg | ORAL_TABLET | Freq: Two times a day (BID) | ORAL | Status: DC

## 2013-02-21 NOTE — ED Notes (Signed)
Pt returned from xray

## 2013-02-21 NOTE — ED Notes (Signed)
Pt requesting pain medication. MD made aware. 

## 2013-06-09 ENCOUNTER — Encounter (HOSPITAL_BASED_OUTPATIENT_CLINIC_OR_DEPARTMENT_OTHER): Payer: Self-pay | Admitting: Emergency Medicine

## 2013-06-09 ENCOUNTER — Emergency Department (HOSPITAL_BASED_OUTPATIENT_CLINIC_OR_DEPARTMENT_OTHER)
Admission: EM | Admit: 2013-06-09 | Discharge: 2013-06-10 | Disposition: A | Discharge status: Home / Self Care | Payer: Medicare Other | Attending: Emergency Medicine | Admitting: Emergency Medicine

## 2013-06-09 ENCOUNTER — Emergency Department (HOSPITAL_BASED_OUTPATIENT_CLINIC_OR_DEPARTMENT_OTHER): Payer: Medicare Other

## 2013-06-09 DIAGNOSIS — Z880 Allergy status to penicillin: Secondary | ICD-10-CM | POA: Insufficient documentation

## 2013-06-09 DIAGNOSIS — Z792 Long term (current) use of antibiotics: Secondary | ICD-10-CM | POA: Insufficient documentation

## 2013-06-09 DIAGNOSIS — B9789 Other viral agents as the cause of diseases classified elsewhere: Secondary | ICD-10-CM | POA: Insufficient documentation

## 2013-06-09 DIAGNOSIS — I10 Essential (primary) hypertension: Secondary | ICD-10-CM | POA: Insufficient documentation

## 2013-06-09 DIAGNOSIS — R52 Pain, unspecified: Secondary | ICD-10-CM | POA: Insufficient documentation

## 2013-06-09 DIAGNOSIS — Z79899 Other long term (current) drug therapy: Secondary | ICD-10-CM | POA: Insufficient documentation

## 2013-06-09 DIAGNOSIS — Z791 Long term (current) use of non-steroidal anti-inflammatories (NSAID): Secondary | ICD-10-CM | POA: Insufficient documentation

## 2013-06-09 DIAGNOSIS — B349 Viral infection, unspecified: Secondary | ICD-10-CM

## 2013-06-09 DIAGNOSIS — M109 Gout, unspecified: Secondary | ICD-10-CM | POA: Insufficient documentation

## 2013-06-09 MED ORDER — ACETAMINOPHEN 325 MG PO TABS
650.0000 mg | ORAL_TABLET | Freq: Once | ORAL | Status: AC
  Administered 2013-06-09: 650 mg via ORAL
  Filled 2013-06-09: qty 2

## 2013-06-09 NOTE — ED Notes (Signed)
Patient having cough, congestion, fever, general body aches

## 2013-06-09 NOTE — ED Provider Notes (Signed)
CSN: [id number]     Arrival date & time 06/09/13  2029 History  This chart was scribed for Kerry Barker Smitty Cords, MD by Quintella Reichert, ED scribe.  This patient was seen in room MH05/MH05 and the patient's care was started at 11:53 PM.   Chief Complaint  Patient presents with  . Influenza    Patient is a 67 y.o. female presenting with cough. The history is provided by the patient. No language interpreter was used.  Cough Cough characteristics:  Non-productive Severity:  Moderate Onset quality:  Gradual Duration:  3 days Timing:  Intermittent Progression:  Unchanged Chronicity:  New Smoker: no   Context: sick contacts   Relieved by:  Nothing Worsened by:  Nothing tried Ineffective treatments:  None tried Associated symptoms: sinus congestion   Associated symptoms: no fever, no rash, no rhinorrhea and no shortness of breath   Risk factors: no recent travel     HPI Comments: Kerry Barker is a 67 y.o. female who presents to the Emergency Department complaining of 3 days of persistent symptoms.  Pt states she has had cough, congestion, generalized body aches.     Past Medical History  Diagnosis Date  . Gout   . Hypertension   . Back pain     Past Surgical History  Procedure Laterality Date  . Abdominal hysterectomy      No family history on file.   History  Substance Use Topics  . Smoking status: Never Smoker   . Smokeless tobacco: Not on file  . Alcohol Use: No    OB History   Grav Para Term Preterm Abortions TAB SAB Ect Mult Living                  Review of Systems  Constitutional: Negative for fever.  HENT: Positive for congestion. Negative for rhinorrhea.   Respiratory: Positive for cough. Negative for shortness of breath.   Skin: Negative for rash.  All other systems reviewed and are negative.     Allergies  Penicillins and Tramadol  Home Medications   Current Outpatient Rx  Name  Route  Sig  Dispense  Refill  . amLODipine (NORVASC) 10 MG  tablet   Oral   Take 10 mg by mouth daily.         Marland Kitchen amLODipine (NORVASC) 5 MG tablet   Oral   Take 5 mg by mouth daily.           . colchicine 0.6 MG tablet   Oral   Take 0.6 mg by mouth as needed.          . doxycycline (VIBRAMYCIN) 100 MG capsule   Oral   Take 100 mg by mouth 2 (two) times daily.         Marland Kitchen HYDROcodone-acetaminophen (NORCO/VICODIN) 5-325 MG per tablet   Oral   Take 1 tablet by mouth every 6 (six) hours as needed for pain.   20 tablet   0   . HYDROcodone-acetaminophen (NORCO/VICODIN) 5-325 MG per tablet   Oral   Take 1-2 tablets by mouth every 6 (six) hours as needed for pain.   20 tablet   0   . Multiple Vitamin (MULTIVITAMIN) tablet   Oral   Take 1 tablet by mouth daily.           . naproxen (NAPROSYN) 500 MG tablet   Oral   Take 1 tablet (500 mg total) by mouth 2 (two) times daily with a meal.   30 tablet  0   . naproxen sodium (ANAPROX) 275 MG tablet   Oral   Take 1 tablet (275 mg total) by mouth 2 (two) times daily with a meal.   30 tablet   0   . PRESCRIPTION MEDICATION   Oral   Take 1 tablet by mouth daily as needed. Gout medication           BP 122/85  Pulse 101  Temp(Src) 100.4 F (38 C) (Oral)  Resp 20  Ht 5\' 6"  (1.676 m)  Wt 212 lb (96.163 kg)  BMI 34.23 kg/m2  SpO2 100%  Physical Exam  Nursing note and vitals reviewed. Constitutional: She is oriented to person, place, and time. She appears well-developed and well-nourished. No distress.  HENT:  Head: Normocephalic and atraumatic.  Mouth/Throat: Oropharynx is clear and moist and mucous membranes are normal. No oropharyngeal exudate, posterior oropharyngeal edema or posterior oropharyngeal erythema.  Eyes: Conjunctivae and EOM are normal. Pupils are equal, round, and reactive to light.  Neck: Normal range of motion. Neck supple. No tracheal deviation present.  Cardiovascular: Normal rate and regular rhythm.   Pulmonary/Chest: Effort normal and breath sounds  normal. No respiratory distress. She has no wheezes. She has no rales.  Abdominal: Soft. Bowel sounds are normal. There is no tenderness. There is no rebound and no guarding.  Musculoskeletal: Normal range of motion.  Lymphadenopathy:    She has no cervical adenopathy.  Neurological: She is alert and oriented to person, place, and time.  Skin: Skin is warm and dry.  Psychiatric: She has a normal mood and affect. Her behavior is normal.    ED Course  Procedures (including critical care time)  DIAGNOSTIC STUDIES: Oxygen Saturation is 100% on room air, normal by my interpretation.    COORDINATION OF CARE: 11:58 PM: Informed pt that symptoms are likely due to influenza and will resolve on their own.  Discussed treatment plan which includes symptomatic relief.  Advised pt to stay hydrated and alternate Tylenol and ibuprofen.  Pt expressed understanding and agreed to plan.   Labs Review Labs Reviewed - No data to display  Imaging Review Dg Chest 2 View  06/09/2013   CLINICAL DATA:  Cough, congestion, fever, and body aches.  EXAM: CHEST  2 VIEW  COMPARISON:  None.  FINDINGS: The heart size and mediastinal contours are within normal limits. Both lungs are clear. The visualized skeletal structures are unremarkable.  IMPRESSION: No active cardiopulmonary disease.   Electronically Signed   By: Burman Nieves M.D.   On: 06/09/2013 21:12    EKG Interpretation   None       MDM  No diagnosis found. Alternate tylenol and ibuprofen, lots of liquids.      I personally performed the services described in this documentation, which was scribed in my presence. The recorded information has been reviewed and is accurate.     Jasmine Awe, MD 06/10/13 [phone]

## 2013-06-10 MED ORDER — IBUPROFEN 600 MG PO TABS: 600.0000 mg | ORAL_TABLET | Freq: Four times a day (QID) | ORAL | Status: AC | PRN

## 2013-06-10 MED ORDER — KETOROLAC TROMETHAMINE 60 MG/2ML IM SOLN
60.0000 mg | Freq: Once | INTRAMUSCULAR | Status: AC
  Administered 2013-06-10: 60 mg via INTRAMUSCULAR
  Filled 2013-06-10: qty 2

## 2013-10-20 ENCOUNTER — Emergency Department (HOSPITAL_BASED_OUTPATIENT_CLINIC_OR_DEPARTMENT_OTHER)
Admission: EM | Admit: 2013-10-20 | Discharge: 2013-10-20 | Disposition: A | Discharge status: Home / Self Care | Payer: Medicare Other | Attending: Emergency Medicine | Admitting: Emergency Medicine

## 2013-10-20 ENCOUNTER — Encounter (HOSPITAL_BASED_OUTPATIENT_CLINIC_OR_DEPARTMENT_OTHER): Payer: Self-pay | Admitting: Emergency Medicine

## 2013-10-20 DIAGNOSIS — M545 Low back pain, unspecified: Secondary | ICD-10-CM

## 2013-10-20 DIAGNOSIS — M109 Gout, unspecified: Secondary | ICD-10-CM | POA: Insufficient documentation

## 2013-10-20 DIAGNOSIS — I10 Essential (primary) hypertension: Secondary | ICD-10-CM | POA: Insufficient documentation

## 2013-10-20 DIAGNOSIS — Z880 Allergy status to penicillin: Secondary | ICD-10-CM | POA: Insufficient documentation

## 2013-10-20 DIAGNOSIS — Z79899 Other long term (current) drug therapy: Secondary | ICD-10-CM | POA: Insufficient documentation

## 2013-10-20 DIAGNOSIS — N760 Acute vaginitis: Secondary | ICD-10-CM | POA: Insufficient documentation

## 2013-10-20 DIAGNOSIS — A499 Bacterial infection, unspecified: Secondary | ICD-10-CM | POA: Insufficient documentation

## 2013-10-20 DIAGNOSIS — N8110 Cystocele, unspecified: Secondary | ICD-10-CM | POA: Diagnosis present

## 2013-10-20 DIAGNOSIS — B9689 Other specified bacterial agents as the cause of diseases classified elsewhere: Secondary | ICD-10-CM | POA: Insufficient documentation

## 2013-10-20 DIAGNOSIS — N8111 Cystocele, midline: Secondary | ICD-10-CM | POA: Insufficient documentation

## 2013-10-20 DIAGNOSIS — Z791 Long term (current) use of non-steroidal anti-inflammatories (NSAID): Secondary | ICD-10-CM | POA: Insufficient documentation

## 2013-10-20 LAB — COMPREHENSIVE METABOLIC PANEL
ALBUMIN: 3.9 g/dL (ref 3.5–5.2)
ALT: 13 U/L (ref 0–35)
AST: 15 U/L (ref 0–37)
Alkaline Phosphatase: 98 U/L (ref 39–117)
BUN: 13 mg/dL (ref 6–23)
CALCIUM: 9.6 mg/dL (ref 8.4–10.5)
CHLORIDE: 106 meq/L (ref 96–112)
CO2: 24 mEq/L (ref 19–32)
CREATININE: 0.8 mg/dL (ref 0.50–1.10)
GFR calc Af Amer: 86 mL/min — ABNORMAL LOW (ref >90)
GFR calc non Af Amer: 75 mL/min — ABNORMAL LOW (ref >90)
Glucose, Bld: 100 mg/dL — ABNORMAL HIGH (ref 70–99)
Potassium: 4.2 mEq/L (ref 3.7–5.3)
SODIUM: 143 meq/L (ref 137–147)
Total Bilirubin: 0.4 mg/dL (ref 0.3–1.2)
Total Protein: 7.9 g/dL (ref 6.0–8.3)

## 2013-10-20 LAB — CBC WITH DIFFERENTIAL/PLATELET
BASOS ABS: 0 10*3/uL (ref 0.0–0.1)
BASOS PCT: 1 % (ref 0–1)
EOS PCT: 1 % (ref 0–5)
Eosinophils Absolute: 0.1 10*3/uL (ref 0.0–0.7)
HEMATOCRIT: 37 % (ref 36.0–46.0)
Hemoglobin: 12.2 g/dL (ref 12.0–15.0)
Lymphocytes Relative: 50 % — ABNORMAL HIGH (ref 12–46)
Lymphs Abs: 2.1 10*3/uL (ref 0.7–4.0)
MCH: 27.9 pg (ref 26.0–34.0)
MCHC: 33 g/dL (ref 30.0–36.0)
MCV: 84.7 fL (ref 78.0–100.0)
MONO ABS: 0.3 10*3/uL (ref 0.1–1.0)
Monocytes Relative: 8 % (ref 3–12)
NEUTROS ABS: 1.7 10*3/uL (ref 1.7–7.7)
Neutrophils Relative %: 40 % — ABNORMAL LOW (ref 43–77)
PLATELETS: 219 10*3/uL (ref 150–400)
RBC: 4.37 MIL/uL (ref 3.87–5.11)
RDW: 14.9 % (ref 11.5–15.5)
WBC: 4.2 10*3/uL (ref 4.0–10.5)

## 2013-10-20 LAB — URINALYSIS, ROUTINE W REFLEX MICROSCOPIC
BILIRUBIN URINE: NEGATIVE
Glucose, UA: NEGATIVE mg/dL
Hgb urine dipstick: NEGATIVE
KETONES UR: NEGATIVE mg/dL
Leukocytes, UA: NEGATIVE
NITRITE: NEGATIVE
PROTEIN: NEGATIVE mg/dL
Specific Gravity, Urine: 1.016 (ref 1.005–1.030)
UROBILINOGEN UA: 0.2 mg/dL (ref 0.0–1.0)
pH: 5 (ref 5.0–8.0)

## 2013-10-20 LAB — WET PREP, GENITAL
Trich, Wet Prep: NONE SEEN
YEAST WET PREP: NONE SEEN

## 2013-10-20 MED ORDER — METRONIDAZOLE 500 MG PO TABS: 500.0000 mg | ORAL_TABLET | Freq: Two times a day (BID) | ORAL | Status: AC

## 2013-10-20 MED ORDER — OXYCODONE-ACETAMINOPHEN 5-325 MG PO TABS: 1.0000 | ORAL_TABLET | Freq: Four times a day (QID) | ORAL | Status: DC | PRN

## 2013-10-20 MED ORDER — OXYCODONE-ACETAMINOPHEN 5-325 MG PO TABS
1.0000 | ORAL_TABLET | Freq: Once | ORAL | Status: AC
  Administered 2013-10-20: 1 via ORAL
  Filled 2013-10-20: qty 1

## 2013-10-20 NOTE — ED Notes (Signed)
Patient here with 1 week of lower back pain and vaginal pain. Patient states that she thinks this is related to possibly her bladder. She reports that she has previously had problems with bladder dropping

## 2013-10-20 NOTE — ED Provider Notes (Signed)
CSN: [id number]     Arrival date & time 10/20/13  0815 History   First MD Initiated Contact with Patient 10/20/13 0825     Chief Complaint  Patient presents with  . Back Pain  . bladder pressure      (Consider location/radiation/quality/duration/timing/severity/associated sxs/prior Treatment) Patient is a 68 y.o. female presenting with back pain. The history is provided by the patient.  Back Pain Location:  Lumbar spine Quality:  Aching Radiates to:  Does not radiate Pain severity:  Moderate Pain is:  Same all the time Onset quality:  Gradual Duration:  2 weeks Timing:  Constant Progression:  Unchanged Chronicity:  New Context comment:  Lifting boxes daily Relieved by:  Nothing Worsened by:  Movement Ineffective treatments: ibuprofen. Associated symptoms: no abdominal pain, no chest pain, no dysuria, no fever and no headaches     Past Medical History  Diagnosis Date  . Gout   . Hypertension   . Back pain    Past Surgical History  Procedure Laterality Date  . Abdominal hysterectomy     No family history on file. History  Substance Use Topics  . Smoking status: Never Smoker   . Smokeless tobacco: Not on file  . Alcohol Use: No   OB History   Grav Para Term Preterm Abortions TAB SAB Ect Mult Living                 Review of Systems  Constitutional: Negative for fever and fatigue.  HENT: Negative for congestion and drooling.   Eyes: Negative for pain.  Respiratory: Negative for cough and shortness of breath.   Cardiovascular: Negative for chest pain.  Gastrointestinal: Negative for nausea, vomiting, abdominal pain and diarrhea.  Genitourinary: Negative for dysuria, hematuria, vaginal discharge and difficulty urinating.  Musculoskeletal: Positive for back pain. Negative for gait problem and neck pain.  Skin: Negative for color change.  Neurological: Negative for dizziness and headaches.  Hematological: Negative for adenopathy.  Psychiatric/Behavioral:  Negative for behavioral problems.  All other systems reviewed and are negative.     Allergies  Penicillins and Tramadol  Home Medications   Prior to Admission medications   Medication Sig Start Date End Date Taking? Authorizing Provider  amLODipine (NORVASC) 10 MG tablet Take 10 mg by mouth daily.    Historical Provider, MD  amLODipine (NORVASC) 5 MG tablet Take 5 mg by mouth daily.      Historical Provider, MD  colchicine 0.6 MG tablet Take 0.6 mg by mouth as needed.     Historical Provider, MD  ibuprofen (ADVIL,MOTRIN) 600 MG tablet Take 1 tablet (600 mg total) by mouth every 6 (six) hours as needed. 06/10/13   April K Palumbo-Rasch, MD  Multiple Vitamin (MULTIVITAMIN) tablet Take 1 tablet by mouth daily.      Historical Provider, MD  PRESCRIPTION MEDICATION Take 1 tablet by mouth daily as needed. Gout medication     Historical Provider, MD   BP 138/88  Pulse 67  Temp(Src) 98.7 F (37.1 C) (Oral)  Resp 24  Ht 5\' 6"  (1.676 m)  Wt 213 lb (96.616 kg)  BMI 34.40 kg/m2  SpO2 100% Physical Exam  Nursing note and vitals reviewed. Constitutional: She is oriented to person, place, and time. She appears well-developed and well-nourished.  HENT:  Head: Normocephalic and atraumatic.  Mouth/Throat: Oropharynx is clear and moist. No oropharyngeal exudate.  Eyes: Conjunctivae and EOM are normal. Pupils are equal, round, and reactive to light.  Neck: Normal range of motion. Neck  supple.  Cardiovascular: Normal rate, regular rhythm, normal heart sounds and intact distal pulses.  Exam reveals no gallop and no friction rub.   No murmur heard. Pulmonary/Chest: Effort normal and breath sounds normal. No respiratory distress. She has no wheezes.  Abdominal: Soft. Bowel sounds are normal. There is tenderness (Mild suprapubictenderness to palpation.). There is no rebound and no guarding.  Genitourinary:  External vagina appears normal. Evidence of grade 1 bladder prolapse on pelvic exam. Possibly  early grade 2 as the bladder wall was seen as soon as the introitus of the vagina was opened w/ the speculum.  Cervix with mild erythema. Os closed. No fluid noted in the posterior fornix. No cervical motion tenderness. No pain during bimanual exam. I attempted manual reduction of the bladder.  Musculoskeletal: Normal range of motion. She exhibits tenderness (mild tenderness to palpation of the paraspinal lower lumbar area bilaterally. No CVA tenderness.). She exhibits no edema.  No vertebral ttp noted.   Neurological: She is alert and oriented to person, place, and time.  Skin: Skin is warm and dry.  Psychiatric: She has a normal mood and affect. Her behavior is normal.    ED Course  Procedures (including critical care time) Labs Review Labs Reviewed  WET PREP, GENITAL - Abnormal; Notable for the following:    Clue Cells Wet Prep HPF POC FEW (*)    WBC, Wet Prep HPF POC FEW (*)    All other components within normal limits  CBC WITH DIFFERENTIAL - Abnormal; Notable for the following:    Neutrophils Relative % 40 (*)    Lymphocytes Relative 50 (*)    All other components within normal limits  COMPREHENSIVE METABOLIC PANEL - Abnormal; Notable for the following:    Glucose, Bld 100 (*)    GFR calc non Af Amer 75 (*)    GFR calc Af Amer 86 (*)    All other components within normal limits  URINE CULTURE  GC/CHLAMYDIA PROBE AMP  URINALYSIS, ROUTINE W REFLEX MICROSCOPIC    Imaging Review No results found.   EKG Interpretation None      MDM   Final diagnoses:  Low back pain  Female bladder prolapse  BV (bacterial vaginosis)    8:42 AM 68 y.o. female who presents with low back pain and pelvic pressure. The patient states that she has been moving recently and has been bending over to take things up. She states that she has had worsening low back aching for the last 2 weeks. She also notes increased lower pelvic pressure during this time. Of note she was seen here approximately one  year ago with noncontributory CT scan and evidence of a prolapsed bladder. She states that she followed up with urology but that no surgical procedure was performed. I suspect her pelvic pressure is related to the prolapsed bladder and her back pain is likely musculoskeletal as it is easily reproduced with palpation. Will get screening labwork, urinalysis, and pain control Percocet.   Few clue cells seen, will tx for BV. Labs otherwise non-contrib. Back pain improved after pain meds. I have discussed the diagnosis/risks/treatment options with the patient and believe the pt to be eligible for discharge home to follow-up with obgyn for eval of her prolapsed bladder. We also discussed returning to the ED immediately if new or worsening sx occur. We discussed the sx which are most concerning (e.g., worsening pain, fever, weakness, numbness, bowel/bladder incont) that necessitate immediate return. Medications administered to the patient during their visit  and any new prescriptions provided to the patient are listed below.  Medications given during this visit Medications  oxyCODONE-acetaminophen (PERCOCET/ROXICET) 5-325 MG per tablet 1 tablet (1 tablet Oral Given 10/20/13 0901)    Discharge Medication List as of 10/20/2013 10:50 AM    START taking these medications   Details  metroNIDAZOLE (FLAGYL) 500 MG tablet Take 1 tablet (500 mg total) by mouth 2 (two) times daily. One po bid x 7 days, Starting 10/20/2013, Until Discontinued, Print    oxyCODONE-acetaminophen (PERCOCET) 5-325 MG per tablet Take 1 tablet by mouth every 6 (six) hours as needed for moderate pain., Starting 10/20/2013, Until Discontinued, Print          Blanchard Kelch, MD 10/21/13 2022

## 2013-10-21 LAB — URINE CULTURE

## 2013-10-21 LAB — GC/CHLAMYDIA PROBE AMP
CT Probe RNA: NEGATIVE
GC Probe RNA: NEGATIVE

## 2013-12-23 ENCOUNTER — Emergency Department (HOSPITAL_BASED_OUTPATIENT_CLINIC_OR_DEPARTMENT_OTHER): Payer: Medicare Other

## 2013-12-23 ENCOUNTER — Emergency Department (HOSPITAL_BASED_OUTPATIENT_CLINIC_OR_DEPARTMENT_OTHER)
Admission: EM | Admit: 2013-12-23 | Discharge: 2013-12-23 | Disposition: A | Discharge status: Home / Self Care | Payer: Medicare Other | Attending: Emergency Medicine | Admitting: Emergency Medicine

## 2013-12-23 ENCOUNTER — Encounter (HOSPITAL_BASED_OUTPATIENT_CLINIC_OR_DEPARTMENT_OTHER): Payer: Self-pay | Admitting: Emergency Medicine

## 2013-12-23 DIAGNOSIS — I10 Essential (primary) hypertension: Secondary | ICD-10-CM | POA: Insufficient documentation

## 2013-12-23 DIAGNOSIS — M79609 Pain in unspecified limb: Secondary | ICD-10-CM | POA: Insufficient documentation

## 2013-12-23 DIAGNOSIS — M79605 Pain in left leg: Secondary | ICD-10-CM

## 2013-12-23 DIAGNOSIS — R110 Nausea: Secondary | ICD-10-CM | POA: Insufficient documentation

## 2013-12-23 DIAGNOSIS — Z880 Allergy status to penicillin: Secondary | ICD-10-CM | POA: Insufficient documentation

## 2013-12-23 DIAGNOSIS — M109 Gout, unspecified: Secondary | ICD-10-CM | POA: Insufficient documentation

## 2013-12-23 DIAGNOSIS — Z79899 Other long term (current) drug therapy: Secondary | ICD-10-CM | POA: Insufficient documentation

## 2013-12-23 MED ORDER — COLCHICINE 0.6 MG PO TABS: 0.6000 mg | ORAL_TABLET | Freq: Every day | ORAL | Status: AC

## 2013-12-23 MED ORDER — HYDROCODONE-ACETAMINOPHEN 5-325 MG PO TABS: 1.0000 | ORAL_TABLET | Freq: Four times a day (QID) | ORAL | Status: DC | PRN

## 2013-12-23 MED ORDER — HYDROCODONE-ACETAMINOPHEN 5-325 MG PO TABS
2.0000 | ORAL_TABLET | Freq: Once | ORAL | Status: AC
  Administered 2013-12-23: 2 via ORAL
  Filled 2013-12-23: qty 2

## 2013-12-23 NOTE — ED Notes (Signed)
Pt reports severe pain in left leg from her left hip to her ankle

## 2013-12-23 NOTE — Discharge Instructions (Signed)

## 2013-12-23 NOTE — ED Provider Notes (Signed)
CSN: [id number]     Arrival date & time 12/23/13  2022 History  This chart was scribed for Kerry Ehlers, MD by Roxan Diesel, ED scribe.  This patient was seen in room MH04/MH04 and the patient's care was started at 9:43 PM.   Chief Complaint  Patient presents with  . Leg Pain    Patient is a 68 y.o. female presenting with leg pain. The history is provided by the patient.  Leg Pain Location:  Leg Time since incident:  3 weeks Injury: no   Leg location:  L leg Pain details:    Quality:  Sharp   Severity:  Severe   Duration:  3 weeks   Timing:  Intermittent (but constant tonight) Chronicity:  New Ineffective treatments:  NSAIDs and acetaminophen Associated symptoms: no back pain, no fatigue, no fever and no neck pain     HPI Comments: Kerry Barker is a 68 y.o. female who presents to the Emergency Department complaining of sharp pain in her left leg from the thight to the foot that has been occurring off-and-on for 3 weeks but worsened tonight.  Pt states pain has been constant tonight.  It is most severe at the knee.  She has attempted to treat pain with Tylenol and ibuprofen, without relief.  She states it appears slightly swollen at the ankle.  She reports associated nausea but denies vomiting, fevers, chills or SOB.  She denies recent falls or injuries.  She denies prior h/o similar pain.  She has h/o gout but states her current symptoms are distinct.  She did take a 4-hour car ride back from [facility] recently.  She is not on hormone therapy.  She does not smoke.  She states she stays in bed "off-and-on."  PCP is at [facility]   Past Medical History  Diagnosis Date  . Gout   . Hypertension   . Back pain     Past Surgical History  Procedure Laterality Date  . Abdominal hysterectomy      History reviewed. No pertinent family history.   History  Substance Use Topics  . Smoking status: Never Smoker   . Smokeless tobacco: Not on file  .  Alcohol Use: No    OB History   Grav Para Term Preterm Abortions TAB SAB Ect Mult Living                   Review of Systems  Constitutional: Negative for fever, chills, diaphoresis, activity change, appetite change and fatigue.  HENT: Negative for congestion, facial swelling, rhinorrhea and sore throat.   Eyes: Negative for photophobia and discharge.  Respiratory: Negative for cough, chest tightness and shortness of breath.   Cardiovascular: Negative for chest pain, palpitations and leg swelling.  Gastrointestinal: Positive for nausea. Negative for vomiting, abdominal pain and diarrhea.  Endocrine: Negative for polydipsia and polyuria.  Genitourinary: Negative for dysuria, frequency, difficulty urinating and pelvic pain.  Musculoskeletal: Negative for back pain, neck pain and neck stiffness.       Left leg pain  Skin: Negative for color change and wound.  Allergic/Immunologic: Negative for immunocompromised state.  Neurological: Negative for facial asymmetry, weakness, numbness and headaches.  Hematological: Does not bruise/bleed easily.  Psychiatric/Behavioral: Negative for confusion and agitation.      Allergies  Penicillins and Tramadol  Home Medications   Prior to Admission medications   Medication Sig Start Date End Date Taking? Authorizing Provider  colchicine 0.6 MG tablet Take 0.6 mg  by mouth as needed.    Yes Historical Provider, MD  Multiple Vitamin (MULTIVITAMIN) tablet Take 1 tablet by mouth daily.     Yes Historical Provider, MD  PRESCRIPTION MEDICATION Take 1 tablet by mouth daily as needed. Gout medication    Yes Historical Provider, MD  amLODipine (NORVASC) 10 MG tablet Take 10 mg by mouth daily.    Historical Provider, MD  amLODipine (NORVASC) 5 MG tablet Take 5 mg by mouth daily.      Historical Provider, MD  colchicine 0.6 MG tablet Take 1 tablet (0.6 mg total) by mouth daily. 12/23/13   Kerry Ehlers, MD  HYDROcodone-acetaminophen (NORCO) 5-325 MG per  tablet Take 1 tablet by mouth every 6 (six) hours as needed. 12/23/13   Kerry Ehlers, MD  ibuprofen (ADVIL,MOTRIN) 600 MG tablet Take 1 tablet (600 mg total) by mouth every 6 (six) hours as needed. 06/10/13   April K Palumbo-Rasch, MD  metroNIDAZOLE (FLAGYL) 500 MG tablet Take 1 tablet (500 mg total) by mouth 2 (two) times daily. One po bid x 7 days 10/20/13   Blanchard Kelch, MD  oxyCODONE-acetaminophen (PERCOCET) 5-325 MG per tablet Take 1 tablet by mouth every 6 (six) hours as needed for moderate pain. 10/20/13   Blanchard Kelch, MD   BP 135/77  Pulse 75  Temp(Src) 98.7 F (37.1 C) (Oral)  Resp 18  Ht 5\' 6"  (1.676 m)  Wt 210 lb (95.255 kg)  BMI 33.91 kg/m2  SpO2 99%  Physical Exam  Nursing note and vitals reviewed. Constitutional: She is oriented to person, place, and time. She appears well-developed and well-nourished. No distress.  HENT:  Head: Normocephalic.  Mouth/Throat: Oropharynx is clear and moist.  Eyes: Pupils are equal, round, and reactive to light.  Neck: Neck supple.  Cardiovascular: Normal rate, regular rhythm and normal heart sounds.   Pulmonary/Chest: Effort normal and breath sounds normal. No respiratory distress. She has no wheezes.  Abdominal: Soft. She exhibits no distension. There is no tenderness. There is no rebound and no guarding.  Musculoskeletal: She exhibits edema and tenderness.  Generalized tenderness to palpation over left leg, worse at left knee.  Mild edema of left ankle.  No other skin changes.  Neurovascularly intact distally.  Neurological: She is alert and oriented to person, place, and time.  Skin: Skin is warm and dry.  Psychiatric: She has a normal mood and affect.    ED Course  Procedures (including critical care time)  DIAGNOSTIC STUDIES: Oxygen Saturation is 99% on room air, normal by my interpretation.    COORDINATION OF CARE: 9:52 PM-Discussed treatment plan which includes ultrasound if available, and anticoagulant  injection and f/u tomorrow morning for ultrasound if not.  Pt expressed understanding and agreed with plan.     Labs Review Labs Reviewed - No data to display  Imaging Review US Venous Img Lower Unilateral Left  12/23/2013   CLINICAL DATA:  Left lower extremity pain and swelling  EXAM: Left LOWER EXTREMITY VENOUS DOPPLER ULTRASOUND  TECHNIQUE: Gray-scale sonography with graded compression, as well as color Doppler and duplex ultrasound were performed to evaluate the lower extremity deep venous systems from the level of the common femoral vein and including the common femoral, femoral, profunda femoral, popliteal and calf veins including the posterior tibial, peroneal and gastrocnemius veins when visible. The superficial great saphenous vein was also interrogated. Spectral Doppler was utilized to evaluate flow at rest and with distal augmentation maneuvers in the common femoral, femoral and  popliteal veins.  COMPARISON:  None.  FINDINGS: Common Femoral Vein: No evidence of thrombus. Normal compressibility, respiratory phasicity and response to augmentation.  Saphenofemoral Junction: No evidence of thrombus.  Profunda Femoral Vein: No evidence of thrombus.  Femoral Vein: No evidence of thrombus.  Popliteal Vein: No evidence of thrombus.  Calf Veins: No evidence of thrombus.  Superficial Great Saphenous Vein: No evidence of thrombus.  Venous Reflux:  None.  Other Findings:  None.  IMPRESSION: No evidence of deep venous thrombosis in the left lower extremity.   Electronically Signed   By: Jorje Guild M.D.   On: 12/23/2013 23:14     EKG Interpretation None      MDM   Final diagnoses:  Left leg pain    Pt is a 68 y.o. female with Pmhx as above who presents with L leg pain from hip to ankle for 3 weeks. No known injury. Pt denies hx of same, though has ED note from 2013 with similar presentation. On PT, she has mild edema of ankle, no other skin changes. +tenderness to entire leg. DVT study  negative. I do not feel there is role for XR given no hx of trauma and no acute PE findings to suggest acute trauma. Given hx of gout, will recommend trial of colchicine and norco and close PCP f/u. Return precautions given for new or worsening symptoms including worsening swelling, redness, fever.    I personally performed the services described in this documentation, which was scribed in my presence. The recorded information has been reviewed and is accurate.    Kerry Ehlers, MD 12/23/13 [phone]

## 2013-12-31 ENCOUNTER — Emergency Department (HOSPITAL_BASED_OUTPATIENT_CLINIC_OR_DEPARTMENT_OTHER)
Admission: EM | Admit: 2013-12-31 | Discharge: 2013-12-31 | Disposition: A | Discharge status: Home / Self Care | Payer: Medicare Other | Attending: Emergency Medicine | Admitting: Emergency Medicine

## 2013-12-31 ENCOUNTER — Emergency Department (HOSPITAL_BASED_OUTPATIENT_CLINIC_OR_DEPARTMENT_OTHER): Payer: Medicare Other

## 2013-12-31 DIAGNOSIS — I10 Essential (primary) hypertension: Secondary | ICD-10-CM | POA: Insufficient documentation

## 2013-12-31 DIAGNOSIS — Z79899 Other long term (current) drug therapy: Secondary | ICD-10-CM | POA: Insufficient documentation

## 2013-12-31 DIAGNOSIS — M25569 Pain in unspecified knee: Secondary | ICD-10-CM | POA: Insufficient documentation

## 2013-12-31 DIAGNOSIS — M109 Gout, unspecified: Secondary | ICD-10-CM | POA: Insufficient documentation

## 2013-12-31 DIAGNOSIS — Z880 Allergy status to penicillin: Secondary | ICD-10-CM | POA: Insufficient documentation

## 2013-12-31 DIAGNOSIS — R52 Pain, unspecified: Secondary | ICD-10-CM | POA: Insufficient documentation

## 2013-12-31 DIAGNOSIS — Z792 Long term (current) use of antibiotics: Secondary | ICD-10-CM | POA: Diagnosis not present

## 2013-12-31 DIAGNOSIS — M25562 Pain in left knee: Secondary | ICD-10-CM

## 2013-12-31 MED ORDER — OXYCODONE-ACETAMINOPHEN 5-325 MG PO TABS: 1.0000 | ORAL_TABLET | Freq: Four times a day (QID) | ORAL | Status: DC | PRN

## 2013-12-31 MED ORDER — OXYCODONE-ACETAMINOPHEN 5-325 MG PO TABS
2.0000 | ORAL_TABLET | Freq: Once | ORAL | Status: AC
  Administered 2013-12-31: 2 via ORAL
  Filled 2013-12-31: qty 2

## 2013-12-31 MED ORDER — KETOROLAC TROMETHAMINE 60 MG/2ML IM SOLN
60.0000 mg | Freq: Once | INTRAMUSCULAR | Status: AC
  Administered 2013-12-31: 60 mg via INTRAMUSCULAR
  Filled 2013-12-31: qty 2

## 2013-12-31 MED ORDER — IBUPROFEN 800 MG PO TABS: 800.0000 mg | ORAL_TABLET | Freq: Three times a day (TID) | ORAL | Status: AC

## 2013-12-31 NOTE — Discharge Instructions (Signed)
Musculoskeletal Pain °Musculoskeletal pain is muscle and boney aches and pains. These pains can occur in any part of the body. Your caregiver may treat you without knowing the cause of the pain. They may treat you if blood or urine tests, X-rays, and other tests were normal.  °CAUSES °There is often not a definite cause or reason for these pains. These pains may be caused by a type of germ (virus). The discomfort may also come from overuse. Overuse includes working out too hard when your body is not fit. Boney aches also come from weather changes. Bone is sensitive to atmospheric pressure changes. °HOME CARE INSTRUCTIONS  °· Ask when your test results will be ready. Make sure you get your test results. °· Only take over-the-counter or prescription medicines for pain, discomfort, or fever as directed by your caregiver. If you were given medications for your condition, do not drive, operate machinery or power tools, or sign legal documents for 24 hours. Do not drink alcohol. Do not take sleeping pills or other medications that may interfere with treatment. °· Continue all activities unless the activities cause more pain. When the pain lessens, slowly resume normal activities. Gradually increase the intensity and duration of the activities or exercise. °· During periods of severe pain, bed rest may be helpful. Lay or sit in any position that is comfortable. °· Putting ice on the injured area. °· Put ice in a bag. °· Place a towel between your skin and the bag. °· Leave the ice on for 15 to 20 minutes, 3 to 4 times a day. °· Follow up with your caregiver for continued problems and no reason can be found for the pain. If the pain becomes worse or does not go away, it may be necessary to repeat tests or do additional testing. Your caregiver may need to look further for a possible cause. °SEEK IMMEDIATE MEDICAL CARE IF: °· You have pain that is getting worse and is not relieved by medications. °· You develop chest pain  that is associated with shortness or breath, sweating, feeling sick to your stomach (nauseous), or throw up (vomit). °· Your pain becomes localized to the abdomen. °· You develop any new symptoms that seem different or that concern you. °MAKE SURE YOU:  °· Understand these instructions. °· Will watch your condition. °· Will get help right away if you are not doing well or get worse. °Document Released: 05/30/2005 Document Revised: 08/22/2011 Document Reviewed: 02/01/2013 °Exit[employer]® Patient Information ©2015 [employer]. This information is not intended to replace advice given to you by your health care provider. Make sure you discuss any questions you have with your health care provider. ° °Knee Pain °The knee is the complex joint between your thigh and your lower leg. It is made up of bones, tendons, ligaments, and cartilage. The bones that make up the knee are: °· The femur in the thigh. °· The tibia and fibula in the lower leg. °· The patella or kneecap riding in the groove on the lower femur. °CAUSES  °Knee pain is a common complaint with many causes. A few of these causes are: °· Injury, such as: °¨ A ruptured ligament or tendon injury. °¨ Torn cartilage. °· Medical conditions, such as: °¨ Gout °¨ Arthritis °¨ Infections °· Overuse, over training, or overdoing a physical activity. °Knee pain can be minor or severe. Knee pain can accompany debilitating injury. Minor knee problems often respond well to self-care measures or get well on their own. More serious   injuries may need medical intervention or even surgery. °SYMPTOMS °The knee is complex. Symptoms of knee problems can vary widely. Some of the problems are: °· Pain with movement and weight bearing. °· Swelling and tenderness. °· Buckling of the knee. °· Inability to straighten or extend your knee. °· Your knee locks and you cannot straighten it. °· Warmth and redness with pain and fever. °· Deformity or dislocation of the kneecap. °DIAGNOSIS  °Determining  what is wrong may be very straight forward such as when there is an injury. It can also be challenging because of the complexity of the knee. Tests to make a diagnosis may include: °· Your caregiver taking a history and doing a physical exam. °· Routine X-rays can be used to rule out other problems. X-rays will not reveal a cartilage tear. Some injuries of the knee can be diagnosed by: °¨ Arthroscopy a surgical technique by which a small video camera is inserted through tiny incisions on the sides of the knee. This procedure is used to examine and repair internal knee joint problems. Tiny instruments can be used during arthroscopy to repair the torn knee cartilage (meniscus). °¨ Arthrography is a radiology technique. A contrast liquid is directly injected into the knee joint. Internal structures of the knee joint then become visible on X-ray film. °¨ An MRI scan is a non X-ray radiology procedure in which magnetic fields and a computer produce two- or three-dimensional images of the inside of the knee. Cartilage tears are often visible using an MRI scanner. MRI scans have largely replaced arthrography in diagnosing cartilage tears of the knee. °· Blood work. °· Examination of the fluid that helps to lubricate the knee joint (synovial fluid). This is done by taking a sample out using a needle and a syringe. °TREATMENT °The treatment of knee problems depends on the cause. Some of these treatments are: °· Depending on the injury, proper casting, splinting, surgery, or physical therapy care will be needed. °· Give yourself adequate recovery time. Do not overuse your joints. If you begin to get sore during workout routines, back off. Slow down or do fewer repetitions. °· For repetitive activities such as cycling or running, maintain your strength and nutrition. °· Alternate muscle groups. For example, if you are a weight lifter, work the upper body on one day and the lower body the next. °· Either tight or weak muscles  do not give the proper support for your knee. Tight or weak muscles do not absorb the stress placed on the knee joint. Keep the muscles surrounding the knee strong. °· Take care of mechanical problems. °¨ If you have flat feet, orthotics or special shoes may help. See your caregiver if you need help. °¨ Arch supports, sometimes with wedges on the inner or outer aspect of the heel, can help. These can shift pressure away from the side of the knee most bothered by osteoarthritis. °¨ A brace called an "unloader" brace also may be used to help ease the pressure on the most arthritic side of the knee. °· If your caregiver has prescribed crutches, braces, wraps or ice, use as directed. The acronym for this is PRICE. This means protection, rest, ice, compression, and elevation. °· Nonsteroidal anti-inflammatory drugs (NSAIDs), can help relieve pain. But if taken immediately after an injury, they may actually increase swelling. Take NSAIDs with food in your stomach. Stop them if you develop stomach problems. Do not take these if you have a history of ulcers, stomach pain, or bleeding   from the bowel. Do not take without your caregiver's approval if you have problems with fluid retention, heart failure, or kidney problems. °· For ongoing knee problems, physical therapy may be helpful. °· Glucosamine and chondroitin are over-the-counter dietary supplements. Both may help relieve the pain of osteoarthritis in the knee. These medicines are different from the usual anti-inflammatory drugs. Glucosamine may decrease the rate of cartilage destruction. °· Injections of a corticosteroid drug into your knee joint may help reduce the symptoms of an arthritis flare-up. They may provide pain relief that lasts a few months. You may have to wait a few months between injections. The injections do have a small increased risk of infection, water retention, and elevated blood sugar levels. °· Hyaluronic acid injected into damaged joints may  ease pain and provide lubrication. These injections may work by reducing inflammation. A series of shots may give relief for as long as 6 months. °· Topical painkillers. Applying certain ointments to your skin may help relieve the pain and stiffness of osteoarthritis. Ask your pharmacist for suggestions. Many over the-counter products are approved for temporary relief of arthritis pain. °· In some countries, doctors often prescribe topical NSAIDs for relief of chronic conditions such as arthritis and tendinitis. A review of treatment with NSAID creams found that they worked as well as oral medications but without the serious side effects. °PREVENTION °· Maintain a healthy weight. Extra pounds put more strain on your joints. °· Get strong, stay limber. Weak muscles are a common cause of knee injuries. Stretching is important. Include flexibility exercises in your workouts. °· Be smart about exercise. If you have osteoarthritis, chronic knee pain or recurring injuries, you may need to change the way you exercise. This does not mean you have to stop being active. If your knees ache after jogging or playing basketball, consider switching to swimming, water aerobics, or other low-impact activities, at least for a few days a week. Sometimes limiting high-impact activities will provide relief. °· Make sure your shoes fit well. Choose footwear that is right for your sport. °· Protect your knees. Use the proper gear for knee-sensitive activities. Use kneepads when playing volleyball or laying carpet. Buckle your seat belt every time you drive. Most shattered kneecaps occur in car accidents. °· Rest when you are tired. °SEEK MEDICAL CARE IF:  °You have knee pain that is continual and does not seem to be getting better.  °SEEK IMMEDIATE MEDICAL CARE IF:  °Your knee joint feels hot to the touch and you have a high fever. °MAKE SURE YOU:  °· Understand these instructions. °· Will watch your condition. °· Will get help right away  if you are not doing well or get worse. °Document Released: 03/27/2007 Document Revised: 08/22/2011 Document Reviewed: 03/27/2007 °ExitCare® Patient Information ©2015 [employer]. This information is not intended to replace advice given to you by your health care provider. Make sure you discuss any questions you have with your health care provider. ° °

## 2013-12-31 NOTE — ED Notes (Signed)
Pt. Reports she was here last week for same compliant of the L leg at the knee and femur.  Pt. Reports pain meds given to her last week are not helping.

## 2013-12-31 NOTE — ED Provider Notes (Signed)
CSN: [id number]     Arrival date & time 12/31/13  1916 History   First MD Initiated Contact with Patient 12/31/13 1940     Chief Complaint  Patient presents with  . Leg Pain     (Consider location/radiation/quality/duration/timing/severity/associated sxs/prior Treatment) Patient is a 68 y.o. female presenting with leg pain. The history is provided by the patient.  Leg Pain Location:  Knee Time since incident:  4 weeks Injury: no   Knee location:  L knee Pain details:    Quality:  Aching   Radiates to:  Does not radiate   Severity:  Moderate   Onset quality:  Gradual   Duration:  4 weeks   Timing:  Constant   Progression:  Unchanged Chronicity:  New Dislocation: no   Prior injury to area:  No Relieved by:  Rest Worsened by:  Bearing weight Associated symptoms: no fever     Past Medical History  Diagnosis Date  . Gout   . Hypertension   . Back pain    Past Surgical History  Procedure Laterality Date  . Abdominal hysterectomy     No family history on file. History  Substance Use Topics  . Smoking status: Never Smoker   . Smokeless tobacco: Not on file  . Alcohol Use: No   OB History   Grav Para Term Preterm Abortions TAB SAB Ect Mult Living                 Review of Systems  Constitutional: Negative for fever and chills.  Respiratory: Negative for cough and shortness of breath.   Gastrointestinal: Negative for vomiting and abdominal pain.  All other systems reviewed and are negative.     Allergies  Penicillins and Tramadol  Home Medications   Prior to Admission medications   Medication Sig Start Date End Date Taking? Authorizing Provider  amLODipine (NORVASC) 10 MG tablet Take 10 mg by mouth daily.    Historical Provider, MD  amLODipine (NORVASC) 5 MG tablet Take 5 mg by mouth daily.      Historical Provider, MD  colchicine 0.6 MG tablet Take 0.6 mg by mouth as needed.     Historical Provider, MD  colchicine 0.6 MG tablet Take 1 tablet (0.6 mg  total) by mouth daily. 12/23/13   Neta Ehlers, MD  HYDROcodone-acetaminophen (NORCO) 5-325 MG per tablet Take 1 tablet by mouth every 6 (six) hours as needed. 12/23/13   Neta Ehlers, MD  ibuprofen (ADVIL,MOTRIN) 600 MG tablet Take 1 tablet (600 mg total) by mouth every 6 (six) hours as needed. 06/10/13   April K Palumbo-Rasch, MD  ibuprofen (ADVIL,MOTRIN) 800 MG tablet Take 1 tablet (800 mg total) by mouth 3 (three) times daily. 12/31/13   Osvaldo Shipper, MD  metroNIDAZOLE (FLAGYL) 500 MG tablet Take 1 tablet (500 mg total) by mouth 2 (two) times daily. One po bid x 7 days 10/20/13   Blanchard Kelch, MD  Multiple Vitamin (MULTIVITAMIN) tablet Take 1 tablet by mouth daily.      Historical Provider, MD  oxyCODONE-acetaminophen (PERCOCET) 5-325 MG per tablet Take 1 tablet by mouth every 6 (six) hours as needed for moderate pain. 10/20/13   Blanchard Kelch, MD  oxyCODONE-acetaminophen (PERCOCET/ROXICET) 5-325 MG per tablet Take 1 tablet by mouth every 6 (six) hours as needed for severe pain. 12/31/13   Osvaldo Shipper, MD  PRESCRIPTION MEDICATION Take 1 tablet by mouth daily as needed. Gout medication     Historical Provider,  MD   BP 118/69  Pulse 77  Temp(Src) 98.5 F (36.9 C) (Oral)  Resp 18  Ht 5\' 6"  (1.676 m)  Wt 210 lb (95.255 kg)  BMI 33.91 kg/m2  SpO2 100% Physical Exam  Nursing note and vitals reviewed. Constitutional: She is oriented to person, place, and time. She appears well-developed and well-nourished. No distress.  HENT:  Head: Normocephalic and atraumatic.  Mouth/Throat: Oropharynx is clear and moist.  Eyes: EOM are normal. Pupils are equal, round, and reactive to light.  Neck: Normal range of motion. Neck supple.  Cardiovascular: Normal rate and regular rhythm.  Exam reveals no friction rub.   No murmur heard. Pulmonary/Chest: Effort normal and breath sounds normal. No respiratory distress. She has no wheezes. She has no rales.  Abdominal: Soft. She  exhibits no distension. There is no tenderness. There is no rebound.  Musculoskeletal: She exhibits no edema.       Left hip: She exhibits normal range of motion, normal strength, no tenderness and no bony tenderness.       Left knee: She exhibits decreased range of motion. She exhibits no swelling, no effusion, no ecchymosis, no deformity and no laceration. Tenderness (mild, diffuse) found.  Neurological: She is alert and oriented to person, place, and time. No cranial nerve deficit. She exhibits normal muscle tone. Coordination normal.  Skin: No rash noted. She is not diaphoretic.    ED Course  Procedures (including critical care time) Labs Review Labs Reviewed - No data to display  Imaging Review Dg Hip Complete Left  12/31/2013   CLINICAL DATA:  Left hip pain  EXAM: LEFT HIP - COMPLETE 2+ VIEW  COMPARISON:  None.  FINDINGS: Pelvic bones are intact. Mild degenerative change of the pubic symphysis. Mild bilateral hip joint space narrowing, bilaterally symmetric. No evidence of left femur fracture or dislocation.  IMPRESSION: No acute findings.  Mild degenerative changes.   Electronically Signed   By: Skipper Cliche M.D.   On: 12/31/2013 21:17   Dg Knee 1-2 Views Left  12/31/2013   CLINICAL DATA:  Left knee pain.  EXAM: LEFT KNEE - 1-2 VIEW  COMPARISON:  Left knee radiographs performed 07/16/2012  FINDINGS: There is no evidence of fracture or dislocation. The joint spaces are preserved. Marginal osteophytes are seen arising at all three compartments. There is cortical irregularity along the articular surface of the patella. A small enthesophyte is seen arising at the upper pole of the patella.  No significant joint effusion is seen. The visualized soft tissues are normal in appearance.  IMPRESSION: 1. No evidence of fracture or dislocation. 2. Mild tricompartmental osteoarthritis noted.   Electronically Signed   By: Garald Balding M.D.   On: 12/31/2013 21:25     EKG Interpretation None       MDM   Final diagnoses:  Knee pain, acute, left    4F here with knee pain. Evaluated for this one week ago with negative DVT studies. Given pain meds, which will help a little bit, but not having a great improvement. Has had symptoms for 4 weeks. No fevers, swelling. Also took course of gout meds without relief. States doesn't feel like gout that she's had in the past. AFVSS here. L knee with some tenderness, no effusion. No concern for septic arthritis. Will xray hip and knee. Knee with some arthritis, none in hip. Arthritis likely source of pain. Given Ortho f/u.    Osvaldo Shipper, MD 12/31/13 [phone]

## 2013-12-31 NOTE — ED Notes (Signed)
Back from xray, snack given.

## 2013-12-31 NOTE — ED Notes (Signed)
Dr. Mingo Amber in top see pt prior to RN assessment, see MD notes, pending orders.

## 2014-07-11 IMAGING — CR DG KNEE COMPLETE 4+V*L*
4 series · 4 of 4 positions shown · non-contrast
Comparison: None.

CLINICAL DATA: Left knee pain for 6 weeks.

LEFT KNEE - COMPLETE 4+ VIEW

[t knee ap left]
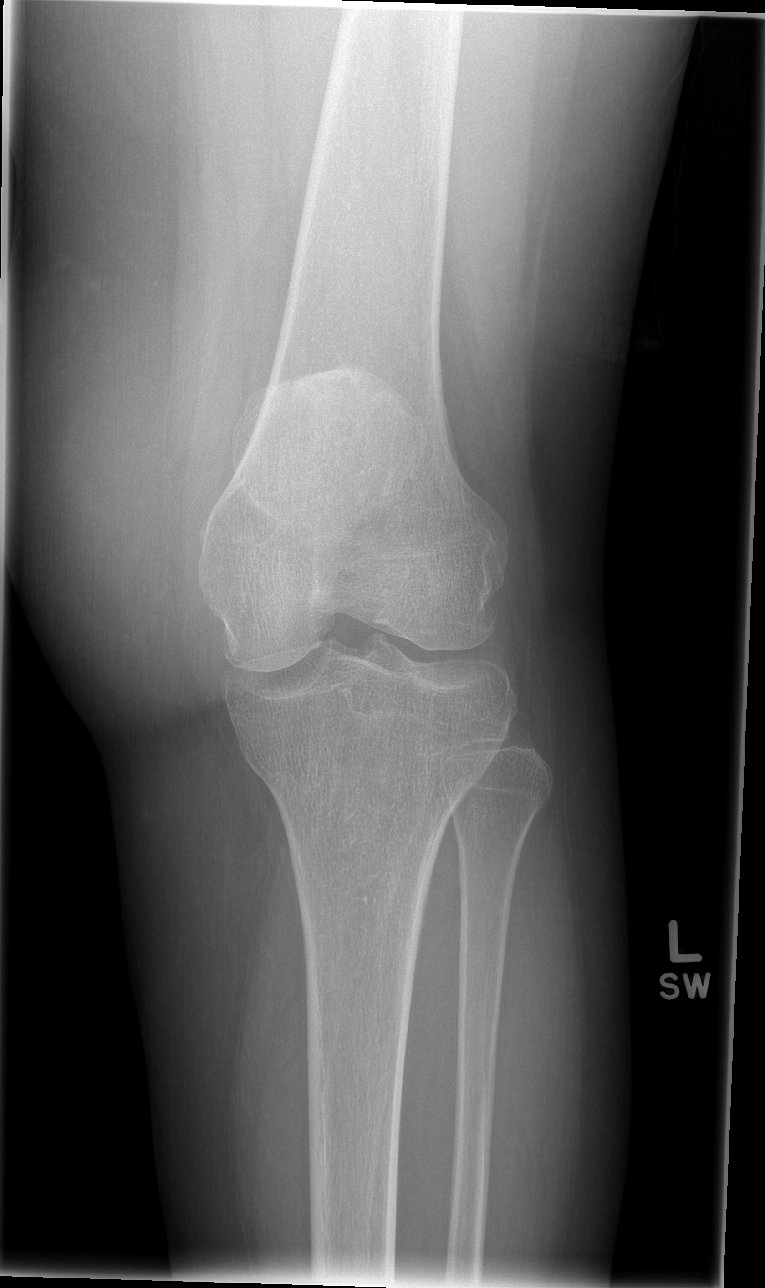

[t knee oblique left (1 of 2)]
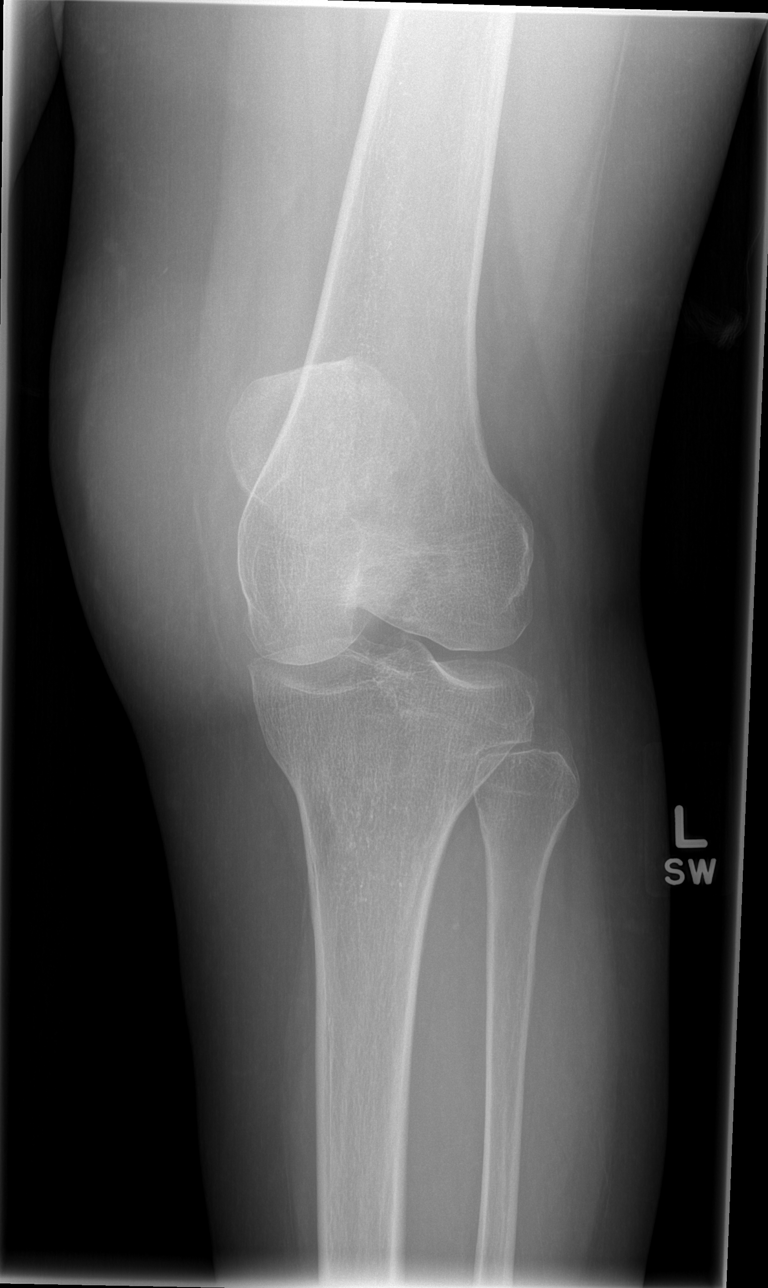

[t knee oblique left (2 of 2)]
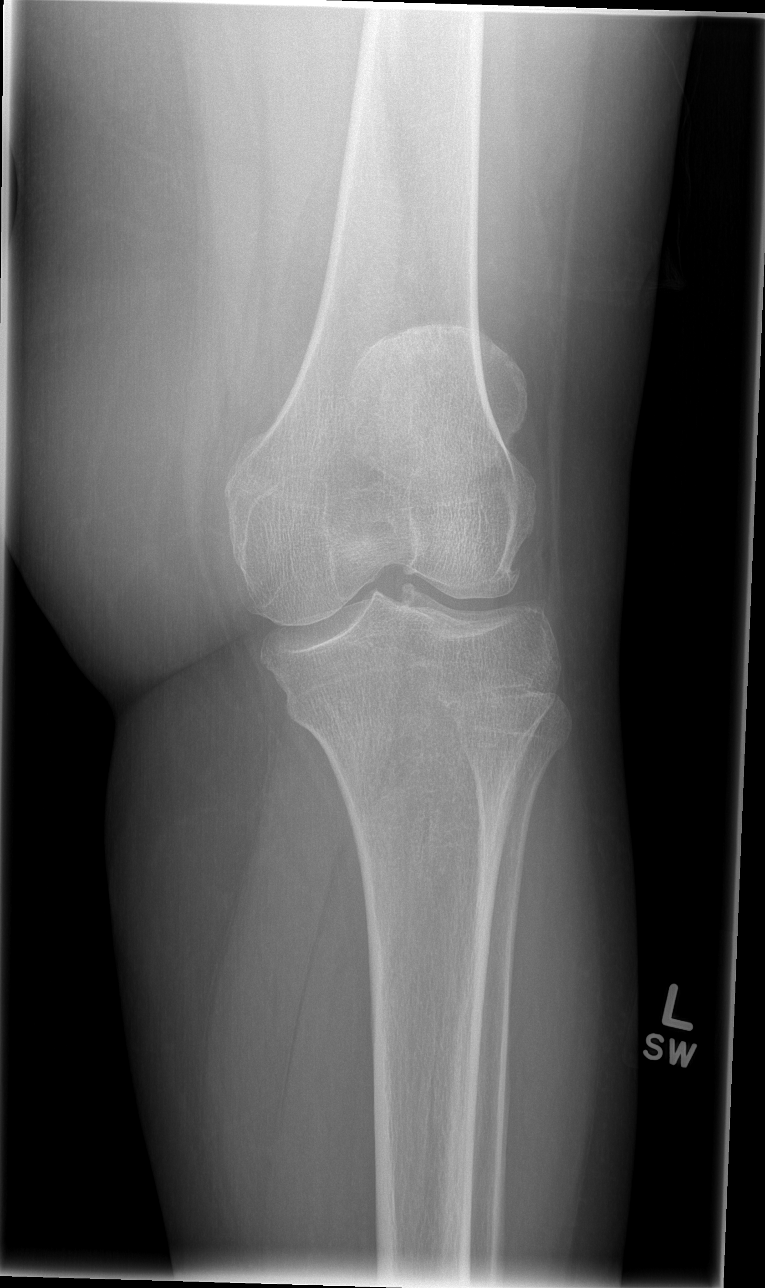

[t knee lat left]
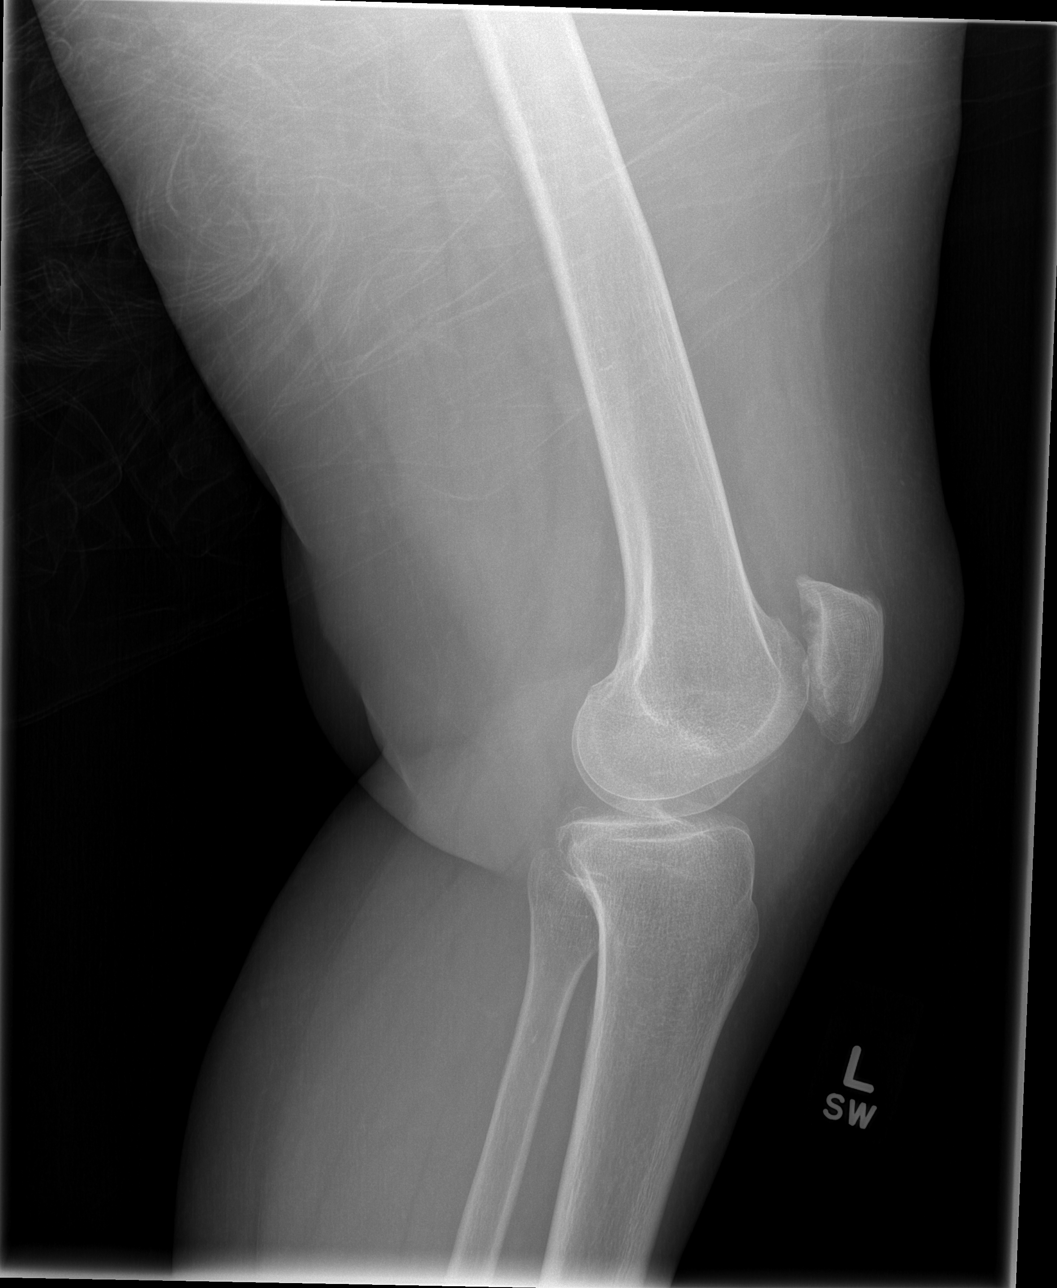

[4 of 4 positions shown; findings below may reference images not displayed]

FINDINGS: Somewhat high-riding patella which may represent patella
alta.  Irregularity of the patellar surface suggest osteochondral
lesion which may represent chondromalacia patella.  Consider MRI
for further evaluation.  Small left knee effusion.  Mild
degenerative changes in the medial and lateral compartments.  No
evidence of acute fracture.  No focal bone lesion or bone
destruction is otherwise appreciated.
IMPRESSION: Patella alta with irregularity of the patellar surface suggesting
chondromalacia.  Mild degenerative changes.  No acute fractures
appreciated.

## 2014-12-02 ENCOUNTER — Emergency Department (HOSPITAL_BASED_OUTPATIENT_CLINIC_OR_DEPARTMENT_OTHER): Payer: Medicare Other

## 2014-12-02 ENCOUNTER — Encounter (HOSPITAL_BASED_OUTPATIENT_CLINIC_OR_DEPARTMENT_OTHER): Payer: Self-pay | Admitting: *Deleted

## 2014-12-02 ENCOUNTER — Emergency Department (HOSPITAL_BASED_OUTPATIENT_CLINIC_OR_DEPARTMENT_OTHER)
Admission: EM | Admit: 2014-12-02 | Discharge: 2014-12-02 | Disposition: A | Discharge status: Home / Self Care | Payer: Medicare Other | Attending: Emergency Medicine | Admitting: Emergency Medicine

## 2014-12-02 DIAGNOSIS — Z880 Allergy status to penicillin: Secondary | ICD-10-CM | POA: Insufficient documentation

## 2014-12-02 DIAGNOSIS — Z79899 Other long term (current) drug therapy: Secondary | ICD-10-CM | POA: Insufficient documentation

## 2014-12-02 DIAGNOSIS — M545 Low back pain, unspecified: Secondary | ICD-10-CM

## 2014-12-02 DIAGNOSIS — I10 Essential (primary) hypertension: Secondary | ICD-10-CM | POA: Diagnosis not present

## 2014-12-02 LAB — URINALYSIS, ROUTINE W REFLEX MICROSCOPIC
BILIRUBIN URINE: NEGATIVE
Glucose, UA: NEGATIVE mg/dL
Hgb urine dipstick: NEGATIVE
KETONES UR: NEGATIVE mg/dL
Leukocytes, UA: NEGATIVE
NITRITE: NEGATIVE
PH: 5 (ref 5.0–8.0)
Protein, ur: NEGATIVE mg/dL
Specific Gravity, Urine: 1.024 (ref 1.005–1.030)
Urobilinogen, UA: 0.2 mg/dL (ref 0.0–1.0)

## 2014-12-02 MED ORDER — CYCLOBENZAPRINE HCL 5 MG PO TABS: 5.0000 mg | ORAL_TABLET | Freq: Three times a day (TID) | ORAL | Status: AC | PRN

## 2014-12-02 MED ORDER — OXYCODONE-ACETAMINOPHEN 5-325 MG PO TABS
1.0000 | ORAL_TABLET | Freq: Once | ORAL | Status: AC
  Administered 2014-12-02: 1 via ORAL
  Filled 2014-12-02: qty 1

## 2014-12-02 MED ORDER — NAPROXEN 500 MG PO TABS
500.0000 mg | ORAL_TABLET | Freq: Two times a day (BID) | ORAL | Status: AC
Start: 2014-12-02 — End: ?

## 2014-12-02 MED ORDER — OXYCODONE-ACETAMINOPHEN 5-325 MG PO TABS
1.0000 | ORAL_TABLET | Freq: Four times a day (QID) | ORAL | Status: AC | PRN
Start: 2014-12-02 — End: ?

## 2014-12-02 NOTE — ED Provider Notes (Signed)
CSN: [id number]     Arrival date & time 12/02/14  1247 History   First MD Initiated Contact with Patient 12/02/14 1259     Chief Complaint  Patient presents with  . Back Pain     (Consider location/radiation/quality/duration/timing/severity/associated sxs/prior Treatment) HPI  Pt presenting with c/o low back pain.  She has hx of prior back pain but states this feels different.  Pain is located in lower back and on both sides of low back.  Some radiation into both upper legs.  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  No fever/chills.  She denies dysuria, she has been urinating more frequently.  No urinary retention, no incontinence of bowel or bladder, no weakness of legs.  Pain is worse with movement and certain positions.  She has been taking ibuprofen which didn't give her much relief.  There are no other associated systemic symptoms, there are no other alleviating or modifying factors.   Past Medical History  Diagnosis Date  . Gout   . Hypertension   . Back pain    Past Surgical History  Procedure Laterality Date  . Abdominal hysterectomy     No family history on file. History  Substance Use Topics  . Smoking status: Never Smoker   . Smokeless tobacco: Not on file  . Alcohol Use: No   OB History    No data available     Review of Systems  ROS reviewed and all otherwise negative except for mentioned in HPI    Allergies  Penicillins and Tramadol  Home Medications   Prior to Admission medications   Medication Sig Start Date End Date Taking? Authorizing Provider  amLODipine (NORVASC) 10 MG tablet Take 10 mg by mouth daily.    Historical Provider, MD  amLODipine (NORVASC) 5 MG tablet Take 5 mg by mouth daily.      Historical Provider, MD  colchicine 0.6 MG tablet Take 0.6 mg by mouth as needed.     Historical Provider, MD  colchicine 0.6 MG tablet Take 1 tablet (0.6 mg total) by mouth  daily. 12/23/13   Ernestina Patches, MD  cyclobenzaprine (FLEXERIL) 5 MG tablet Take 1 tablet (5 mg total) by mouth 3 (three) times daily as needed for muscle spasms. 12/02/14   Alfonzo Beers, MD  HYDROcodone-acetaminophen (NORCO) 5-325 MG per tablet Take 1 tablet by mouth every 6 (six) hours as needed. 12/23/13   Ernestina Patches, MD  ibuprofen (ADVIL,MOTRIN) 600 MG tablet Take 1 tablet (600 mg total) by mouth every 6 (six) hours as needed. 06/10/13   April Palumbo, MD  ibuprofen (ADVIL,MOTRIN) 800 MG tablet Take 1 tablet (800 mg total) by mouth 3 (three) times daily. 12/31/13   Evelina Bucy, MD  metroNIDAZOLE (FLAGYL) 500 MG tablet Take 1 tablet (500 mg total) by mouth 2 (two) times daily. One po bid x 7 days 10/20/13   Pamella Pert, MD  Multiple Vitamin (MULTIVITAMIN) tablet Take 1 tablet by mouth daily.      Historical Provider, MD  naproxen (NAPROSYN) 500 MG tablet Take 1 tablet (500 mg total) by mouth 2 (two) times daily. 12/02/14   Alfonzo Beers, MD  oxyCODONE-acetaminophen (PERCOCET/ROXICET) 5-325 MG per tablet Take 1-2 tablets by mouth every 6 (six) hours as needed for severe pain. 12/02/14   Alfonzo Beers, MD  PRESCRIPTION MEDICATION Take 1 tablet by mouth daily as needed. Gout medication     Historical  Provider, MD   BP 129/87 mmHg  Pulse 67  Temp(Src) 98.7 F (37.1 C) (Oral)  Resp 18  Ht 5\' 6"  (1.676 m)  Wt 210 lb (95.255 kg)  BMI 33.91 kg/m2  SpO2 100%  Vitals reviewed Physical Exam  Physical Examination: General appearance - alert, well appearing, and in no distress Mental status - alert, oriented to person, place, and time Eyes - no conjunctival injection, no scleral icterus Neck- no midline tenderness of cspine, FROM without pain Chest - clear to auscultation, no wheezes, rales or rhonchi, symmetric air entry Heart - normal rate, regular rhythm, normal S1, S2, no murmurs, rubs, clicks or gallops Back exam - full range of motion, no tenderness, palpable spasm or pain on  motion Neurological - alert, oriented, strength 5/5 in bilateral lower extremities, sensation intact Extremities - peripheral pulses normal, no pedal edema, no clubbing or cyanosis Skin - normal coloration and turgor, no rashes  ED Course  Procedures (including critical care time) Labs Review Labs Reviewed  URINALYSIS, ROUTINE W REFLEX MICROSCOPIC (NOT AT [facility])    Imaging Review Dg Lumbar Spine Complete  12/02/2014   CLINICAL DATA:  Lumbago for 3 days.  No history of trauma.  EXAM: LUMBAR SPINE - COMPLETE 4+ VIEW  COMPARISON:  January 10, 2011  FINDINGS: Frontal, lateral, spot lumbosacral lateral, and bilateral oblique views were obtained. The there are 5 non-rib-bearing lumbar type vertebral bodies. There is no fracture or spondylolisthesis. There is moderate disc space narrowing at L4-5 and L5-S1. There is facet osteoarthritic change at L5-S1 bilaterally. There are scattered foci of atherosclerotic change in the abdominal aorta.  IMPRESSION: Areas of osteoarthritic change.  No fracture or spondylolisthesis.   Electronically Signed   By: Lowella Grip III M.D.   On: 12/02/2014 14:05     EKG Interpretation None      MDM   Final diagnoses:  Midline low back pain without sciatica    Pt presenting with midline low back pain with radiation to both sides of low back.  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  Xray reassuring.   Xray images viewed and interpreted by me as well.  Pt given meds for home use.  Pain treated in the ED as well.  No signs or symptoms of cauda equina.  No fever to suggest epidural abscess.  Discharged with strict return precautions.  Pt agreeable with plan.     Alfonzo Beers, MD 12/03/14 [phone]

## 2014-12-02 NOTE — ED Notes (Signed)
Back pain since Sunday. She ran out pain medication.

## 2014-12-02 NOTE — ED Notes (Signed)
MD at bedside. 

## 2014-12-02 NOTE — Discharge Instructions (Signed)
Return to the ED with any concerns including fever/chills, weakness of legs, not able to urinate, loss of control of bowel or bladder, decreased level of alertness/lethargy, or any other alarming symptoms

## 2014-12-02 NOTE — ED Notes (Signed)
Pt requesting pain medication at this time. Will make EDP aware.

## 2014-12-02 NOTE — ED Notes (Signed)
Pt transported to XR.  

## 2014-12-26 ENCOUNTER — Emergency Department (HOSPITAL_BASED_OUTPATIENT_CLINIC_OR_DEPARTMENT_OTHER): Payer: Medicare Other

## 2014-12-26 ENCOUNTER — Encounter (HOSPITAL_BASED_OUTPATIENT_CLINIC_OR_DEPARTMENT_OTHER): Payer: Self-pay | Admitting: *Deleted

## 2014-12-26 ENCOUNTER — Emergency Department (HOSPITAL_BASED_OUTPATIENT_CLINIC_OR_DEPARTMENT_OTHER)
Admission: EM | Admit: 2014-12-26 | Discharge: 2014-12-26 | Disposition: A | Discharge status: Home / Self Care | Payer: Medicare Other | Attending: Emergency Medicine | Admitting: Emergency Medicine

## 2014-12-26 DIAGNOSIS — Z791 Long term (current) use of non-steroidal anti-inflammatories (NSAID): Secondary | ICD-10-CM | POA: Insufficient documentation

## 2014-12-26 DIAGNOSIS — M25562 Pain in left knee: Secondary | ICD-10-CM | POA: Insufficient documentation

## 2014-12-26 DIAGNOSIS — Z79899 Other long term (current) drug therapy: Secondary | ICD-10-CM | POA: Insufficient documentation

## 2014-12-26 DIAGNOSIS — Z880 Allergy status to penicillin: Secondary | ICD-10-CM | POA: Diagnosis not present

## 2014-12-26 DIAGNOSIS — M79605 Pain in left leg: Secondary | ICD-10-CM

## 2014-12-26 DIAGNOSIS — I10 Essential (primary) hypertension: Secondary | ICD-10-CM | POA: Insufficient documentation

## 2014-12-26 DIAGNOSIS — M25569 Pain in unspecified knee: Secondary | ICD-10-CM

## 2014-12-26 MED ORDER — HYDROCODONE-ACETAMINOPHEN 5-325 MG PO TABS: 1.0000 | ORAL_TABLET | Freq: Four times a day (QID) | ORAL | Status: AC | PRN

## 2014-12-26 NOTE — ED Provider Notes (Signed)
CSN: [id number]     Arrival date & time 12/26/14  1925 History  This chart was scribed for Veryl Speak, MD by Stephania Fragmin, ED Scribe. This patient was seen in room MHFT1/MHFT1 and the patient's care was started at 7:54 PM.    Chief Complaint  Patient presents with  . Knee Pain   Patient is a 69 y.o. female presenting with knee pain. The history is provided by the patient. No language interpreter was used.  Knee Pain Location:  Knee Knee location:  L knee Pain details:    Radiates to:  L leg (left foot)   Severity:  Severe   Onset quality:  Gradual   Duration:  1 week   Timing:  Constant   Progression:  Worsening Chronicity:  New Dislocation: no   Foreign body present:  No foreign bodies Tetanus status:  Unknown Relieved by:  None tried Worsened by:  Nothing tried Ineffective treatments:  None tried   HPI Comments: Kerry Barker is a 69 y.o. female who presents to the Emergency Department complaining of left knee pain radiating down to her foot that has been intermittent for 1 week. She denies any known trauma or injury. Patient states she had knee problems "a long time ago," but hasn't had any problems since, Patient is able to ambulate with worsening pain. She denies pain in her right leg.   Past Medical History  Diagnosis Date  . Gout   . Hypertension   . Back pain    Past Surgical History  Procedure Laterality Date  . Abdominal hysterectomy     No family history on file. History  Substance Use Topics  . Smoking status: Never Smoker   . Smokeless tobacco: Not on file  . Alcohol Use: No   OB History    No data available     Review of Systems  Musculoskeletal: Positive for arthralgias (left knee pain).  All other systems reviewed and are negative.  Allergies  Penicillins and Tramadol  Home Medications   Prior to Admission medications   Medication Sig Start Date End Date Taking? Authorizing Provider  amLODipine (NORVASC) 10 MG tablet Take 10 mg by mouth daily.     Historical Provider, MD  amLODipine (NORVASC) 5 MG tablet Take 5 mg by mouth daily.      Historical Provider, MD  colchicine 0.6 MG tablet Take 0.6 mg by mouth as needed.     Historical Provider, MD  colchicine 0.6 MG tablet Take 1 tablet (0.6 mg total) by mouth daily. 12/23/13   Ernestina Patches, MD  cyclobenzaprine (FLEXERIL) 5 MG tablet Take 1 tablet (5 mg total) by mouth 3 (three) times daily as needed for muscle spasms. 12/02/14   Alfonzo Beers, MD  HYDROcodone-acetaminophen (NORCO) 5-325 MG per tablet Take 1 tablet by mouth every 6 (six) hours as needed. 12/23/13   Ernestina Patches, MD  ibuprofen (ADVIL,MOTRIN) 600 MG tablet Take 1 tablet (600 mg total) by mouth every 6 (six) hours as needed. 06/10/13   April Palumbo, MD  ibuprofen (ADVIL,MOTRIN) 800 MG tablet Take 1 tablet (800 mg total) by mouth 3 (three) times daily. 12/31/13   Evelina Bucy, MD  metroNIDAZOLE (FLAGYL) 500 MG tablet Take 1 tablet (500 mg total) by mouth 2 (two) times daily. One po bid x 7 days 10/20/13   Pamella Pert, MD  Multiple Vitamin (MULTIVITAMIN) tablet Take 1 tablet by mouth daily.      Historical Provider, MD  naproxen (NAPROSYN) 500 MG tablet Take 1  tablet (500 mg total) by mouth 2 (two) times daily. 12/02/14   Alfonzo Beers, MD  oxyCODONE-acetaminophen (PERCOCET/ROXICET) 5-325 MG per tablet Take 1-2 tablets by mouth every 6 (six) hours as needed for severe pain. 12/02/14   Alfonzo Beers, MD  PRESCRIPTION MEDICATION Take 1 tablet by mouth daily as needed. Gout medication     Historical Provider, MD   BP 138/72 mmHg  Pulse 82  Temp(Src) 99 F (37.2 C) (Oral)  Resp 18  Ht 5\' 6"  (1.676 m)  Wt 210 lb (95.255 kg)  BMI 33.91 kg/m2  SpO2 99% Physical Exam  Constitutional: She is oriented to person, place, and time. She appears well-developed and well-nourished. No distress.  HENT:  Head: Normocephalic and atraumatic.  Right Ear: Hearing normal.  Left Ear: Hearing normal.  Nose: Nose normal.  Mouth/Throat:  Oropharynx is clear and moist and mucous membranes are normal.  Eyes: Conjunctivae and EOM are normal. Pupils are equal, round, and reactive to light.  Neck: Normal range of motion. Neck supple.  Cardiovascular: Regular rhythm, S1 normal and S2 normal.  Exam reveals no gallop and no friction rub.   No murmur heard. Pulmonary/Chest: Effort normal and breath sounds normal. No respiratory distress. She exhibits no tenderness.  Abdominal: Soft. Normal appearance and bowel sounds are normal. There is no hepatosplenomegaly. There is no tenderness. There is no rebound, no guarding, no tenderness at McBurney's point and negative Murphy's sign. No hernia.  Musculoskeletal: Normal range of motion. She exhibits tenderness.  Left knee and lower leg appears grossly normal. There is pain with ROM of the knee, however no crepitus. The knee is stable AP/laterally. There is tenderness to the left calf and posterior knee.   Neurological: She is alert and oriented to person, place, and time. She has normal strength. No cranial nerve deficit or sensory deficit. Coordination normal. GCS eye subscore is 4. GCS verbal subscore is 5. GCS motor subscore is 6.  Skin: Skin is warm, dry and intact. No rash noted. No cyanosis.  Psychiatric: She has a normal mood and affect. Her speech is normal and behavior is normal. Thought content normal.  Nursing note and vitals reviewed.   ED Course  Procedures (including critical care time)  DIAGNOSTIC STUDIES: Oxygen Saturation is 99% on RA, normal by my interpretation.    COORDINATION OF CARE: 7:55 PM - Discussed treatment plan with pt at bedside which includes left knee XR and U/S to screen for DVT, and pt agreed to plan.  Imaging Review US Venous Img Lower Unilateral Left  12/26/2014   CLINICAL DATA:  69 year old female with left knee/foot pain and swelling  EXAM: LEFT LOWER EXTREMITY VENOUS DOPPLER ULTRASOUND  TECHNIQUE: Gray-scale sonography with graded compression, as well  as color Doppler and duplex ultrasound were performed to evaluate the lower extremity deep venous systems from the level of the common femoral vein and including the common femoral, femoral, profunda femoral, popliteal and calf veins including the posterior tibial, peroneal and gastrocnemius veins when visible. The superficial great saphenous vein was also interrogated. Spectral Doppler was utilized to evaluate flow at rest and with distal augmentation maneuvers in the common femoral, femoral and popliteal veins.  COMPARISON:  None.  FINDINGS: Contralateral Common Femoral Vein: Respiratory phasicity is normal and symmetric with the symptomatic side. No evidence of thrombus. Normal compressibility.  Common Femoral Vein: No evidence of thrombus. Normal compressibility, respiratory phasicity and response to augmentation.  Saphenofemoral Junction: No evidence of thrombus. Normal compressibility and flow on color  Doppler imaging.  Profunda Femoral Vein: No evidence of thrombus. Normal compressibility and flow on color Doppler imaging.  Femoral Vein: No evidence of thrombus. Normal compressibility, respiratory phasicity and response to augmentation.  Popliteal Vein: No evidence of thrombus. Normal compressibility, respiratory phasicity and response to augmentation.  Calf Veins: No evidence of thrombus. Normal compressibility and flow on color Doppler imaging.  Superficial Great Saphenous Vein: No evidence of thrombus. Normal compressibility and flow on color Doppler imaging.  Venous Reflux:  None.  Other Findings:  None.  IMPRESSION: No evidence of deep venous thrombosis.   Electronically Signed   By: Anner Crete M.D.   On: 12/26/2014 20:40   Dg Knee Complete 4 Views Left  12/26/2014   CLINICAL DATA:  Left knee pain  EXAM: LEFT KNEE - COMPLETE 4+ VIEW  COMPARISON:  12/31/2013  FINDINGS: Degenerative changes are noted in the knee joint in all 3 joint compartments. No joint effusion is seen. No acute fracture or  dislocation is noted. No gross soft tissue abnormality is seen.  IMPRESSION: Degenerative change without acute abnormality.   Electronically Signed   By: Inez Catalina M.D.   On: 12/26/2014 20:56    MDM   Final diagnoses:  None    Ultrasound is negative for DVT and x-rays reveal osteoarthritis. I will recommend anti-inflammatory, pain medication, rest, and follow-up with orthopedics if not improving. Through her primary doctor.   I personally performed the services described in this documentation, which was scribed in my presence. The recorded information has been reviewed and is accurate.      Veryl Speak, MD 12/26/14 2209

## 2014-12-26 NOTE — ED Notes (Signed)
Left leg pain. Knee, lower leg and foot hurts.

## 2014-12-26 NOTE — Discharge Instructions (Signed)
Ibuprofen 600 mg every 6 hours as needed for pain.  Hydrocodone as needed for pain not relieved with ibuprofen.  Follow-up with your primary Dr. if not improving in the next week to discuss a possible referral to an orthopedic surgeon.   Knee Pain The knee is the complex joint between your thigh and your lower leg. It is made up of bones, tendons, ligaments, and cartilage. The bones that make up the knee are:  The femur in the thigh.  The tibia and fibula in the lower leg.  The patella or kneecap riding in the groove on the lower femur. CAUSES  Knee pain is a common complaint with many causes. A few of these causes are:  Injury, such as:  A ruptured ligament or tendon injury.  Torn cartilage.  Medical conditions, such as:  Gout  Arthritis  Infections  Overuse, over training, or overdoing a physical activity. Knee pain can be minor or severe. Knee pain can accompany debilitating injury. Minor knee problems often respond well to self-care measures or get well on their own. More serious injuries may need medical intervention or even surgery. SYMPTOMS The knee is complex. Symptoms of knee problems can vary widely. Some of the problems are:  Pain with movement and weight bearing.  Swelling and tenderness.  Buckling of the knee.  Inability to straighten or extend your knee.  Your knee locks and you cannot straighten it.  Warmth and redness with pain and fever.  Deformity or dislocation of the kneecap. DIAGNOSIS  Determining what is wrong may be very straight forward such as when there is an injury. It can also be challenging because of the complexity of the knee. Tests to make a diagnosis may include:  Your caregiver taking a history and doing a physical exam.  Routine X-rays can be used to rule out other problems. X-rays will not reveal a cartilage tear. Some injuries of the knee can be diagnosed by:  Arthroscopy a surgical technique by which a small video camera  is inserted through tiny incisions on the sides of the knee. This procedure is used to examine and repair internal knee joint problems. Tiny instruments can be used during arthroscopy to repair the torn knee cartilage (meniscus).  Arthrography is a radiology technique. A contrast liquid is directly injected into the knee joint. Internal structures of the knee joint then become visible on X-ray film.  An MRI scan is a non X-ray radiology procedure in which magnetic fields and a computer produce two- or three-dimensional images of the inside of the knee. Cartilage tears are often visible using an MRI scanner. MRI scans have largely replaced arthrography in diagnosing cartilage tears of the knee.  Blood work.  Examination of the fluid that helps to lubricate the knee joint (synovial fluid). This is done by taking a sample out using a needle and a syringe. TREATMENT The treatment of knee problems depends on the cause. Some of these treatments are:  Depending on the injury, proper casting, splinting, surgery, or physical therapy care will be needed.  Give yourself adequate recovery time. Do not overuse your joints. If you begin to get sore during workout routines, back off. Slow down or do fewer repetitions.  For repetitive activities such as cycling or running, maintain your strength and nutrition.  Alternate muscle groups. For example, if you are a weight lifter, work the upper body on one day and the lower body the next.  Either tight or weak muscles do not give the  proper support for your knee. Tight or weak muscles do not absorb the stress placed on the knee joint. Keep the muscles surrounding the knee strong.  Take care of mechanical problems.  If you have flat feet, orthotics or special shoes may help. See your caregiver if you need help.  Arch supports, sometimes with wedges on the inner or outer aspect of the heel, can help. These can shift pressure away from the side of the knee most  bothered by osteoarthritis.  A brace called an "unloader" brace also may be used to help ease the pressure on the most arthritic side of the knee.  If your caregiver has prescribed crutches, braces, wraps or ice, use as directed. The acronym for this is PRICE. This means protection, rest, ice, compression, and elevation.  Nonsteroidal anti-inflammatory drugs (NSAIDs), can help relieve pain. But if taken immediately after an injury, they may actually increase swelling. Take NSAIDs with food in your stomach. Stop them if you develop stomach problems. Do not take these if you have a history of ulcers, stomach pain, or bleeding from the bowel. Do not take without your caregiver's approval if you have problems with fluid retention, heart failure, or kidney problems.  For ongoing knee problems, physical therapy may be helpful.  Glucosamine and chondroitin are over-the-counter dietary supplements. Both may help relieve the pain of osteoarthritis in the knee. These medicines are different from the usual anti-inflammatory drugs. Glucosamine may decrease the rate of cartilage destruction.  Injections of a corticosteroid drug into your knee joint may help reduce the symptoms of an arthritis flare-up. They may provide pain relief that lasts a few months. You may have to wait a few months between injections. The injections do have a small increased risk of infection, water retention, and elevated blood sugar levels.  Hyaluronic acid injected into damaged joints may ease pain and provide lubrication. These injections may work by reducing inflammation. A series of shots may give relief for as long as 6 months.  Topical painkillers. Applying certain ointments to your skin may help relieve the pain and stiffness of osteoarthritis. Ask your pharmacist for suggestions. Many over the-counter products are approved for temporary relief of arthritis pain.  In some countries, doctors often prescribe topical NSAIDs for  relief of chronic conditions such as arthritis and tendinitis. A review of treatment with NSAID creams found that they worked as well as oral medications but without the serious side effects. PREVENTION  Maintain a healthy weight. Extra pounds put more strain on your joints.  Get strong, stay limber. Weak muscles are a common cause of knee injuries. Stretching is important. Include flexibility exercises in your workouts.  Be smart about exercise. If you have osteoarthritis, chronic knee pain or recurring injuries, you may need to change the way you exercise. This does not mean you have to stop being active. If your knees ache after jogging or playing basketball, consider switching to swimming, water aerobics, or other low-impact activities, at least for a few days a week. Sometimes limiting high-impact activities will provide relief.  Make sure your shoes fit well. Choose footwear that is right for your sport.  Protect your knees. Use the proper gear for knee-sensitive activities. Use kneepads when playing volleyball or laying carpet. Buckle your seat belt every time you drive. Most shattered kneecaps occur in car accidents.  Rest when you are tired. SEEK MEDICAL CARE IF:  You have knee pain that is continual and does not seem to be getting better.  SEEK IMMEDIATE MEDICAL CARE IF:  Your knee joint feels hot to the touch and you have a high fever. MAKE SURE YOU:   Understand these instructions.  Will watch your condition.  Will get help right away if you are not doing well or get worse. Document Released: 03/27/2007 Document Revised: 08/22/2011 Document Reviewed: 03/27/2007 [facility] Patient Information 2015 [place], [place]. This information is not intended to replace advice given to you by your health care provider. Make sure you discuss any questions you have with your health care provider.

## 2015-07-28 DIAGNOSIS — I10 Essential (primary) hypertension: Secondary | ICD-10-CM | POA: Diagnosis not present

## 2015-07-28 DIAGNOSIS — N390 Urinary tract infection, site not specified: Secondary | ICD-10-CM | POA: Diagnosis not present

## 2015-07-28 DIAGNOSIS — M549 Dorsalgia, unspecified: Secondary | ICD-10-CM | POA: Diagnosis not present

## 2015-08-18 DIAGNOSIS — N390 Urinary tract infection, site not specified: Secondary | ICD-10-CM | POA: Diagnosis not present

## 2015-08-18 DIAGNOSIS — Z130 Encounter for screening for diseases of the blood and blood-forming organs and certain disorders involving the immune mechanism: Secondary | ICD-10-CM | POA: Diagnosis not present

## 2015-08-18 DIAGNOSIS — R5383 Other fatigue: Secondary | ICD-10-CM | POA: Diagnosis not present

## 2015-08-23 DIAGNOSIS — M791 Myalgia: Secondary | ICD-10-CM | POA: Diagnosis not present

## 2015-08-23 DIAGNOSIS — M79606 Pain in leg, unspecified: Secondary | ICD-10-CM | POA: Diagnosis not present

## 2015-08-23 DIAGNOSIS — Z79899 Other long term (current) drug therapy: Secondary | ICD-10-CM | POA: Diagnosis not present

## 2015-08-23 DIAGNOSIS — M1990 Unspecified osteoarthritis, unspecified site: Secondary | ICD-10-CM | POA: Diagnosis not present

## 2015-08-23 DIAGNOSIS — M25511 Pain in right shoulder: Secondary | ICD-10-CM | POA: Diagnosis not present

## 2015-08-23 DIAGNOSIS — I10 Essential (primary) hypertension: Secondary | ICD-10-CM | POA: Diagnosis not present

## 2015-10-29 DIAGNOSIS — E7800 Pure hypercholesterolemia, unspecified: Secondary | ICD-10-CM | POA: Diagnosis not present

## 2015-10-29 DIAGNOSIS — M109 Gout, unspecified: Secondary | ICD-10-CM | POA: Diagnosis not present

## 2015-10-29 DIAGNOSIS — I10 Essential (primary) hypertension: Secondary | ICD-10-CM | POA: Diagnosis not present

## 2015-10-29 DIAGNOSIS — R319 Hematuria, unspecified: Secondary | ICD-10-CM | POA: Diagnosis not present

## 2015-10-29 DIAGNOSIS — D649 Anemia, unspecified: Secondary | ICD-10-CM | POA: Diagnosis not present

## 2015-10-30 DIAGNOSIS — E7800 Pure hypercholesterolemia, unspecified: Secondary | ICD-10-CM | POA: Diagnosis not present

## 2015-10-30 DIAGNOSIS — I10 Essential (primary) hypertension: Secondary | ICD-10-CM | POA: Diagnosis not present

## 2015-10-30 DIAGNOSIS — M109 Gout, unspecified: Secondary | ICD-10-CM | POA: Diagnosis not present

## 2015-10-30 DIAGNOSIS — D649 Anemia, unspecified: Secondary | ICD-10-CM | POA: Diagnosis not present

## 2016-01-27 DIAGNOSIS — R319 Hematuria, unspecified: Secondary | ICD-10-CM | POA: Diagnosis not present

## 2016-01-27 DIAGNOSIS — I10 Essential (primary) hypertension: Secondary | ICD-10-CM | POA: Diagnosis not present

## 2016-01-27 DIAGNOSIS — L700 Acne vulgaris: Secondary | ICD-10-CM | POA: Diagnosis not present

## 2016-02-19 DIAGNOSIS — K047 Periapical abscess without sinus: Secondary | ICD-10-CM | POA: Diagnosis not present

## 2016-02-19 DIAGNOSIS — K069 Disorder of gingiva and edentulous alveolar ridge, unspecified: Secondary | ICD-10-CM | POA: Diagnosis not present

## 2016-04-04 ENCOUNTER — Emergency Department (HOSPITAL_BASED_OUTPATIENT_CLINIC_OR_DEPARTMENT_OTHER)
Admission: EM | Admit: 2016-04-04 | Discharge: 2016-04-04 | Disposition: A | Discharge status: Home / Self Care | Payer: PPO | Attending: Physician Assistant | Admitting: Physician Assistant

## 2016-04-04 ENCOUNTER — Emergency Department (HOSPITAL_BASED_OUTPATIENT_CLINIC_OR_DEPARTMENT_OTHER): Payer: PPO

## 2016-04-04 ENCOUNTER — Encounter (HOSPITAL_BASED_OUTPATIENT_CLINIC_OR_DEPARTMENT_OTHER): Payer: Self-pay | Admitting: Emergency Medicine

## 2016-04-04 DIAGNOSIS — S39012A Strain of muscle, fascia and tendon of lower back, initial encounter: Secondary | ICD-10-CM | POA: Diagnosis not present

## 2016-04-04 DIAGNOSIS — R109 Unspecified abdominal pain: Secondary | ICD-10-CM | POA: Diagnosis not present

## 2016-04-04 DIAGNOSIS — R52 Pain, unspecified: Secondary | ICD-10-CM

## 2016-04-04 DIAGNOSIS — Y939 Activity, unspecified: Secondary | ICD-10-CM | POA: Insufficient documentation

## 2016-04-04 DIAGNOSIS — Y999 Unspecified external cause status: Secondary | ICD-10-CM | POA: Insufficient documentation

## 2016-04-04 DIAGNOSIS — Z79899 Other long term (current) drug therapy: Secondary | ICD-10-CM | POA: Diagnosis not present

## 2016-04-04 DIAGNOSIS — I10 Essential (primary) hypertension: Secondary | ICD-10-CM | POA: Insufficient documentation

## 2016-04-04 DIAGNOSIS — Y929 Unspecified place or not applicable: Secondary | ICD-10-CM | POA: Insufficient documentation

## 2016-04-04 DIAGNOSIS — X58XXXA Exposure to other specified factors, initial encounter: Secondary | ICD-10-CM | POA: Diagnosis not present

## 2016-04-04 DIAGNOSIS — S3992XA Unspecified injury of lower back, initial encounter: Secondary | ICD-10-CM | POA: Diagnosis not present

## 2016-04-04 DIAGNOSIS — M545 Low back pain: Secondary | ICD-10-CM | POA: Diagnosis not present

## 2016-04-04 LAB — URINALYSIS, ROUTINE W REFLEX MICROSCOPIC
Bilirubin Urine: NEGATIVE
Glucose, UA: NEGATIVE mg/dL
Ketones, ur: NEGATIVE mg/dL
Nitrite: NEGATIVE
PH: 5.5 (ref 5.0–8.0)
PROTEIN: NEGATIVE mg/dL
Specific Gravity, Urine: 1.022 (ref 1.005–1.030)

## 2016-04-04 LAB — URINE MICROSCOPIC-ADD ON

## 2016-04-04 MED ORDER — NAPROXEN 250 MG PO TABS
500.0000 mg | ORAL_TABLET | Freq: Once | ORAL | Status: AC
  Administered 2016-04-04: 500 mg via ORAL
  Filled 2016-04-04: qty 2

## 2016-04-04 MED ORDER — NAPROXEN 500 MG PO TABS: 500.0000 mg | ORAL_TABLET | Freq: Two times a day (BID) | ORAL | 0 refills | Status: AC

## 2016-04-04 MED ORDER — CYCLOBENZAPRINE HCL 10 MG PO TABS: 10.0000 mg | ORAL_TABLET | Freq: Two times a day (BID) | ORAL | 0 refills | Status: AC | PRN

## 2016-04-04 MED ORDER — CYCLOBENZAPRINE HCL 10 MG PO TABS
10.0000 mg | ORAL_TABLET | Freq: Once | ORAL | Status: AC
  Administered 2016-04-04: 10 mg via ORAL
  Filled 2016-04-04: qty 1

## 2016-04-04 NOTE — ED Provider Notes (Signed)
[place] DEPT [facility] Provider Note   CSN: [id number] Arrival date & time: 04/04/16  1246     History   Chief Complaint Chief Complaint  Patient presents with  . Flank Pain    HPI Kerry Barker is a 70 y.o. female.  HPI   Patient is a 70 year old female presenting with bilateral flank pain and back pain. Patient reports starting Saturday. She reports she had same thing about a year ago. She reports that she's been struggling with hematuria. She has  had "bladder drop". Patient unable to further explain what this is. However she reports that she's had hematuria for the last several years and has been worked up for bladder cancer but non found.  Sh remebers coming here with similar pain about a year ago but was unsure what they found.  Past Medical History:  Diagnosis Date  . Back pain   . Gout   . Hypertension     Patient Active Problem List   Diagnosis Date Noted  . Female bladder prolapse 10/20/2013  . Low back pain 10/20/2013  . BV (bacterial vaginosis) 10/20/2013    Past Surgical History:  Procedure Laterality Date  . ABDOMINAL HYSTERECTOMY      OB History    No data available       Home Medications    Prior to Admission medications   Medication Sig Start Date End Date Taking? Authorizing Provider  amLODipine (NORVASC) 10 MG tablet Take 10 mg by mouth daily.    Historical Provider, MD  colchicine 0.6 MG tablet Take 1 tablet (0.6 mg total) by mouth daily. 12/23/13   Ernestina Patches, MD  cyclobenzaprine (FLEXERIL) 5 MG tablet Take 1 tablet (5 mg total) by mouth 3 (three) times daily as needed for muscle spasms. 12/02/14   Alfonzo Beers, MD  HYDROcodone-acetaminophen (NORCO) 5-325 MG per tablet Take 1-2 tablets by mouth every 6 (six) hours as needed. 12/26/14   Veryl Speak, MD  ibuprofen (ADVIL,MOTRIN) 600 MG tablet Take 1 tablet (600 mg total) by mouth every 6 (six) hours as needed. 06/10/13   April Palumbo, MD  ibuprofen (ADVIL,MOTRIN) 800 MG tablet Take 1  tablet (800 mg total) by mouth 3 (three) times daily. 12/31/13   Evelina Bucy, MD  metroNIDAZOLE (FLAGYL) 500 MG tablet Take 1 tablet (500 mg total) by mouth 2 (two) times daily. One po bid x 7 days 10/20/13   Pamella Pert, MD  Multiple Vitamin (MULTIVITAMIN) tablet Take 1 tablet by mouth daily.      Historical Provider, MD  naproxen (NAPROSYN) 500 MG tablet Take 1 tablet (500 mg total) by mouth 2 (two) times daily. 12/02/14   Alfonzo Beers, MD  oxyCODONE-acetaminophen (PERCOCET/ROXICET) 5-325 MG per tablet Take 1-2 tablets by mouth every 6 (six) hours as needed for severe pain. 12/02/14   Alfonzo Beers, MD    Family History No family history on file.  Social History Social History  Substance Use Topics  . Smoking status: Never Smoker  . Smokeless tobacco: Never Used  . Alcohol use No     Allergies   Penicillins   Review of Systems Review of Systems  Constitutional: Negative for activity change, fatigue and fever.  Cardiovascular: Negative for chest pain.  Gastrointestinal: Negative for abdominal pain, diarrhea, nausea and vomiting.  Genitourinary: Positive for flank pain and hematuria. Negative for dysuria and frequency.  Musculoskeletal: Positive for back pain.  Skin: Negative for color change and rash.  All other systems reviewed and are negative.  Physical Exam Updated Vital Signs BP 133/82 (BP Location: Right Arm)   Pulse 77   Temp 98.3 F (36.8 C) (Oral)   Resp 18   Ht 5\' 6"  (1.676 m)   Wt 210 lb (95.3 kg)   SpO2 100%   BMI 33.89 kg/m   Physical Exam  Constitutional: She is oriented to person, place, and time. She appears well-developed and well-nourished.  HENT:  Head: Normocephalic and atraumatic.  Eyes: Pupils are equal, round, and reactive to light. Right eye exhibits no discharge.  Cardiovascular: Normal rate and regular rhythm.   Pulmonary/Chest: Effort normal.  Abdominal: Soft. There is no tenderness.  Musculoskeletal:  Pain to palpation along  L-spine and bilateral flanks.  Neurological: She is oriented to person, place, and time.  Skin: Skin is warm and dry. She is not diaphoretic.  Psychiatric: She has a normal mood and affect.  Nursing note and vitals reviewed.    ED Treatments / Results  Labs (all labs ordered are listed, but only abnormal results are displayed) Labs Reviewed  URINALYSIS, ROUTINE W REFLEX MICROSCOPIC (NOT AT [facility]) - Abnormal; Notable for the following:       Result Value   Hgb urine dipstick TRACE (*)    Leukocytes, UA SMALL (*)    All other components within normal limits  URINE MICROSCOPIC-ADD ON - Abnormal; Notable for the following:    Squamous Epithelial / LPF 6-30 (*)    Bacteria, UA FEW (*)    All other components within normal limits    EKG  EKG Interpretation None       Radiology Ct L-spine No Charge  Result Date: 04/04/2016 CLINICAL DATA:  Bilateral flank and low back pain with urinary frequency and urgency. Nausea. EXAM: CT LUMBAR SPINE WITHOUT CONTRAST TECHNIQUE: Multidetector CT imaging of the lumbar spine was performed without intravenous contrast administration. Multiplanar CT image reconstructions were also generated. COMPARISON:  Lumbar spine radiographs 12/02/2014. CT abdomen and pelvis 03/18/2013. FINDINGS: Segmentation: 5 lumbar type vertebrae. Alignment: Normal. Vertebrae: Preserved vertebral body heights without evidence of fracture or suspicious lytic or blastic lesion. Paraspinal and other soft tissues: Unremarkable paraspinal soft tissues. Evaluation of abdominal viscera reported separately. Disc levels: L1-2:  Negative. L2-3: Mild disc bulging and mild facet arthrosis result in mild bilateral lateral recess stenosis without significant spinal or neural foraminal stenosis. L3-4: Vacuum disc phenomenon. Circumferential disc bulging, more focal small central disc osteophyte complex, and mild facet arthrosis result in mild spinal stenosis, mild bilateral lateral recess stenosis,  and mild bilateral neural foraminal stenosis. L4-5: Mild disc space narrowing. Circumferential disc bulging, ligamentum flavum thickening, and mild facet arthrosis result in mild spinal stenosis, moderate bilateral lateral recess stenosis, and mild bilateral neural foraminal stenosis. L5-S1:  Mild facet arthrosis without stenosis. IMPRESSION: Mild lumbar disc and facet degeneration, most notable at L4-5 where there is mild spinal and moderate lateral recess stenosis. Electronically Signed   By: Logan Bores M.D.   On: 04/04/2016 14:40   Ct Renal Stone Study  Result Date: 04/04/2016 CLINICAL DATA:  Bilateral flank and low back pain with urinary frequency and urgency. Nausea. EXAM: CT ABDOMEN AND PELVIS WITHOUT CONTRAST TECHNIQUE: Multidetector CT imaging of the abdomen and pelvis was performed following the standard protocol without IV contrast. COMPARISON:  03/18/2013 FINDINGS: Lower chest: Minimal dependent atelectasis in the lung bases. No pleural effusion. Hepatobiliary: No focal liver abnormality is seen. No gallstones, gallbladder wall thickening, or biliary dilatation. Pancreas: Unremarkable. Spleen: Unremarkable. Adrenals/Urinary Tract: Unremarkable adrenal glands.  1.2 cm hypodensity in the upper pole the right kidney is unchanged and compatible with a cyst. Additional subcentimeter bilateral renal hypodensities are again seen and are too small to fully characterize. No renal calculi or hydronephrosis is seen. No ureteral calculi or ureteral dilatation is identified. The bladder is unremarkable. Stomach/Bowel: The stomach is within normal limits. There is no evidence of bowel obstruction or wall thickening. The appendix is not clearly identified, however no inflammatory changes are seen in the right lower quadrant. Vascular/Lymphatic: Aortic atherosclerosis without aneurysm. No enlarged lymph nodes are identified. Reproductive: Status post hysterectomy. No adnexal masses. Other: No intraperitoneal free  fluid. No abdominal wall mass or hernia. Musculoskeletal: No acute osseous abnormality or suspicious lytic or blastic osseous lesion identified. IMPRESSION: 1. No acute abnormality identified in the abdomen or pelvis. 2. No evidence of urinary tract calculi or obstruction. 3. Aortic atherosclerosis. Electronically Signed   By: Logan Bores M.D.   On: 04/04/2016 14:34    Procedures Procedures (including critical care time)  Medications Ordered in ED Medications  naproxen (NAPROSYN) tablet 500 mg (500 mg Oral Given 04/04/16 1529)  cyclobenzaprine (FLEXERIL) tablet 10 mg (10 mg Oral Given 04/04/16 1529)     Initial Impression / Assessment and Plan / ED Course  I have reviewed the triage vital signs and the nursing notes.  Pertinent labs & imaging results that were available during my care of the patient were reviewed by me and considered in my medical decision making (see chart for details).  Clinical Course  Patient is 70 year old female with past medical history significant for hematuria and prolapsed bladder. She is here because she has back pain. She reports that she has pain along the spine and along both sides. It does not sound like a renal stone all however with hematuria I want to make sure the patient does not have a stone. Patient says she's had this pain before. Reviewing notes. She was diagnosed with back strain a year ago and given Naprosyn and Flexeril.  We'll rule out stone, pathologic fracture given her age. If the CT does not show any intervening able process , will plan on discharging with muscular skeletal back pain.  CT negative.  The pain is really in a band across the back and worse with movement.  With normal vitals, normal CT and normal urine, there is not any further work up that I can think would be benefiical.   We'll treat as muscle sprain and have her follow up with primary care physician in the next 2-3 days if not better.  Patient is comfortable, ambulatory,  and taking PO at time of discharge.  Patient expressed understanding about return precautions.    Final Clinical Impressions(s) / ED Diagnoses   Final diagnoses:  Pain    New Prescriptions New Prescriptions   No medications on file     Markella Dao Julio Alm, MD 04/04/16 1533

## 2016-04-04 NOTE — Discharge Instructions (Signed)
Your CAT scan physical exam urine and vital signs are all reassuring. We're unsure what is causing your back pain. We want to treat it as a muscle strain. However if it continues to be a problem please return immediately to the emergency department or your primary care physician. Please follow-up with your urologist about hematuria that you've been having.

## 2016-04-04 NOTE — ED Triage Notes (Signed)
Flank and back pain since Saturday along with frequency and urgency.  No fever.  Pt has some nausea.

## 2016-04-14 DIAGNOSIS — R3129 Other microscopic hematuria: Secondary | ICD-10-CM | POA: Diagnosis not present

## 2016-04-27 DIAGNOSIS — Z1231 Encounter for screening mammogram for malignant neoplasm of breast: Secondary | ICD-10-CM | POA: Diagnosis not present

## 2016-07-06 DIAGNOSIS — F319 Bipolar disorder, unspecified: Secondary | ICD-10-CM | POA: Diagnosis not present

## 2016-07-06 DIAGNOSIS — R319 Hematuria, unspecified: Secondary | ICD-10-CM | POA: Diagnosis not present

## 2016-07-06 DIAGNOSIS — Z1211 Encounter for screening for malignant neoplasm of colon: Secondary | ICD-10-CM | POA: Diagnosis not present

## 2016-07-06 DIAGNOSIS — E559 Vitamin D deficiency, unspecified: Secondary | ICD-10-CM | POA: Diagnosis not present

## 2016-07-06 DIAGNOSIS — E669 Obesity, unspecified: Secondary | ICD-10-CM | POA: Diagnosis not present

## 2016-07-06 DIAGNOSIS — M545 Low back pain: Secondary | ICD-10-CM | POA: Diagnosis not present

## 2016-07-06 DIAGNOSIS — I10 Essential (primary) hypertension: Secondary | ICD-10-CM | POA: Diagnosis not present

## 2016-09-29 DIAGNOSIS — M79604 Pain in right leg: Secondary | ICD-10-CM | POA: Diagnosis not present

## 2016-09-29 DIAGNOSIS — M79605 Pain in left leg: Secondary | ICD-10-CM | POA: Diagnosis not present

## 2016-09-29 DIAGNOSIS — M545 Low back pain: Secondary | ICD-10-CM | POA: Diagnosis not present

## 2016-09-29 DIAGNOSIS — Z880 Allergy status to penicillin: Secondary | ICD-10-CM | POA: Diagnosis not present

## 2016-09-29 DIAGNOSIS — I10 Essential (primary) hypertension: Secondary | ICD-10-CM | POA: Diagnosis not present

## 2016-09-29 DIAGNOSIS — M5136 Other intervertebral disc degeneration, lumbar region: Secondary | ICD-10-CM | POA: Diagnosis not present

## 2016-09-29 DIAGNOSIS — M1990 Unspecified osteoarthritis, unspecified site: Secondary | ICD-10-CM | POA: Diagnosis not present

## 2016-10-20 DIAGNOSIS — Z8371 Family history of colonic polyps: Secondary | ICD-10-CM | POA: Diagnosis not present

## 2016-10-20 DIAGNOSIS — D120 Benign neoplasm of cecum: Secondary | ICD-10-CM | POA: Diagnosis not present

## 2016-10-20 DIAGNOSIS — Z1211 Encounter for screening for malignant neoplasm of colon: Secondary | ICD-10-CM | POA: Diagnosis not present

## 2016-10-20 DIAGNOSIS — K649 Unspecified hemorrhoids: Secondary | ICD-10-CM | POA: Diagnosis not present

## 2016-10-20 DIAGNOSIS — K648 Other hemorrhoids: Secondary | ICD-10-CM | POA: Diagnosis not present

## 2016-11-02 DIAGNOSIS — E7800 Pure hypercholesterolemia, unspecified: Secondary | ICD-10-CM | POA: Diagnosis not present

## 2016-11-02 DIAGNOSIS — M5442 Lumbago with sciatica, left side: Secondary | ICD-10-CM | POA: Diagnosis not present

## 2016-11-02 DIAGNOSIS — E559 Vitamin D deficiency, unspecified: Secondary | ICD-10-CM | POA: Diagnosis not present

## 2016-11-02 DIAGNOSIS — I10 Essential (primary) hypertension: Secondary | ICD-10-CM | POA: Diagnosis not present

## 2016-11-02 DIAGNOSIS — E669 Obesity, unspecified: Secondary | ICD-10-CM | POA: Diagnosis not present

## 2017-01-05 DIAGNOSIS — J019 Acute sinusitis, unspecified: Secondary | ICD-10-CM | POA: Diagnosis not present

## 2017-01-05 DIAGNOSIS — R300 Dysuria: Secondary | ICD-10-CM | POA: Diagnosis not present

## 2017-01-13 DIAGNOSIS — R319 Hematuria, unspecified: Secondary | ICD-10-CM | POA: Diagnosis not present

## 2017-01-13 DIAGNOSIS — R110 Nausea: Secondary | ICD-10-CM | POA: Diagnosis not present

## 2017-01-13 DIAGNOSIS — M549 Dorsalgia, unspecified: Secondary | ICD-10-CM | POA: Diagnosis not present

## 2017-01-13 DIAGNOSIS — R300 Dysuria: Secondary | ICD-10-CM | POA: Diagnosis not present

## 2017-01-18 DIAGNOSIS — R300 Dysuria: Secondary | ICD-10-CM | POA: Diagnosis not present

## 2017-01-18 DIAGNOSIS — N281 Cyst of kidney, acquired: Secondary | ICD-10-CM | POA: Diagnosis not present

## 2017-01-18 DIAGNOSIS — M549 Dorsalgia, unspecified: Secondary | ICD-10-CM | POA: Diagnosis not present

## 2017-01-18 DIAGNOSIS — R110 Nausea: Secondary | ICD-10-CM | POA: Diagnosis not present

## 2017-02-17 DIAGNOSIS — E559 Vitamin D deficiency, unspecified: Secondary | ICD-10-CM | POA: Diagnosis not present

## 2017-02-17 DIAGNOSIS — R319 Hematuria, unspecified: Secondary | ICD-10-CM | POA: Diagnosis not present

## 2017-02-17 DIAGNOSIS — I10 Essential (primary) hypertension: Secondary | ICD-10-CM | POA: Diagnosis not present

## 2017-02-17 DIAGNOSIS — E7800 Pure hypercholesterolemia, unspecified: Secondary | ICD-10-CM | POA: Diagnosis not present

## 2017-02-17 DIAGNOSIS — M545 Low back pain: Secondary | ICD-10-CM | POA: Diagnosis not present

## 2017-02-17 DIAGNOSIS — E669 Obesity, unspecified: Secondary | ICD-10-CM | POA: Diagnosis not present

## 2017-03-01 DIAGNOSIS — I10 Essential (primary) hypertension: Secondary | ICD-10-CM | POA: Diagnosis not present

## 2017-03-01 DIAGNOSIS — Z880 Allergy status to penicillin: Secondary | ICD-10-CM | POA: Diagnosis not present

## 2017-03-01 DIAGNOSIS — Z79899 Other long term (current) drug therapy: Secondary | ICD-10-CM | POA: Diagnosis not present

## 2017-03-01 DIAGNOSIS — M1711 Unilateral primary osteoarthritis, right knee: Secondary | ICD-10-CM | POA: Diagnosis not present

## 2017-03-01 DIAGNOSIS — M109 Gout, unspecified: Secondary | ICD-10-CM | POA: Diagnosis not present

## 2017-03-01 DIAGNOSIS — M25561 Pain in right knee: Secondary | ICD-10-CM | POA: Diagnosis not present

## 2017-03-01 DIAGNOSIS — M13861 Other specified arthritis, right knee: Secondary | ICD-10-CM | POA: Diagnosis not present

## 2017-04-05 DIAGNOSIS — E119 Type 2 diabetes mellitus without complications: Secondary | ICD-10-CM | POA: Diagnosis not present

## 2017-04-05 DIAGNOSIS — M109 Gout, unspecified: Secondary | ICD-10-CM | POA: Diagnosis not present

## 2017-04-05 DIAGNOSIS — R319 Hematuria, unspecified: Secondary | ICD-10-CM | POA: Diagnosis not present

## 2017-04-05 DIAGNOSIS — D649 Anemia, unspecified: Secondary | ICD-10-CM | POA: Diagnosis not present

## 2017-04-05 DIAGNOSIS — R809 Proteinuria, unspecified: Secondary | ICD-10-CM | POA: Diagnosis not present

## 2017-04-05 DIAGNOSIS — D520 Dietary folate deficiency anemia: Secondary | ICD-10-CM | POA: Diagnosis not present

## 2017-04-05 DIAGNOSIS — N250 Renal osteodystrophy: Secondary | ICD-10-CM | POA: Diagnosis not present

## 2017-04-05 DIAGNOSIS — R309 Painful micturition, unspecified: Secondary | ICD-10-CM | POA: Diagnosis not present

## 2017-04-05 DIAGNOSIS — I10 Essential (primary) hypertension: Secondary | ICD-10-CM | POA: Diagnosis not present

## 2017-04-06 DIAGNOSIS — I10 Essential (primary) hypertension: Secondary | ICD-10-CM | POA: Diagnosis not present

## 2017-04-06 DIAGNOSIS — R319 Hematuria, unspecified: Secondary | ICD-10-CM | POA: Diagnosis not present

## 2017-05-05 DIAGNOSIS — M25561 Pain in right knee: Secondary | ICD-10-CM | POA: Diagnosis not present

## 2017-05-19 DIAGNOSIS — R05 Cough: Secondary | ICD-10-CM | POA: Diagnosis not present

## 2017-05-19 DIAGNOSIS — R8290 Unspecified abnormal findings in urine: Secondary | ICD-10-CM | POA: Diagnosis not present

## 2017-05-19 DIAGNOSIS — R51 Headache: Secondary | ICD-10-CM | POA: Diagnosis not present

## 2017-05-19 DIAGNOSIS — I10 Essential (primary) hypertension: Secondary | ICD-10-CM | POA: Diagnosis not present

## 2017-06-14 DIAGNOSIS — R234 Changes in skin texture: Secondary | ICD-10-CM | POA: Diagnosis not present

## 2017-06-14 DIAGNOSIS — R6883 Chills (without fever): Secondary | ICD-10-CM | POA: Diagnosis not present

## 2017-06-14 DIAGNOSIS — J329 Chronic sinusitis, unspecified: Secondary | ICD-10-CM | POA: Diagnosis not present

## 2017-07-10 DIAGNOSIS — Z1231 Encounter for screening mammogram for malignant neoplasm of breast: Secondary | ICD-10-CM | POA: Diagnosis not present

## 2017-07-19 DIAGNOSIS — S161XXA Strain of muscle, fascia and tendon at neck level, initial encounter: Secondary | ICD-10-CM | POA: Diagnosis not present

## 2017-07-19 DIAGNOSIS — S29012A Strain of muscle and tendon of back wall of thorax, initial encounter: Secondary | ICD-10-CM | POA: Diagnosis not present

## 2017-07-19 DIAGNOSIS — S0990XA Unspecified injury of head, initial encounter: Secondary | ICD-10-CM | POA: Diagnosis not present

## 2017-07-19 DIAGNOSIS — M1711 Unilateral primary osteoarthritis, right knee: Secondary | ICD-10-CM | POA: Diagnosis not present

## 2017-07-19 DIAGNOSIS — S300XXA Contusion of lower back and pelvis, initial encounter: Secondary | ICD-10-CM | POA: Diagnosis not present

## 2017-07-19 DIAGNOSIS — S1093XA Contusion of unspecified part of neck, initial encounter: Secondary | ICD-10-CM | POA: Diagnosis not present

## 2017-07-19 DIAGNOSIS — S3993XA Unspecified injury of pelvis, initial encounter: Secondary | ICD-10-CM | POA: Diagnosis not present

## 2017-07-19 DIAGNOSIS — S8991XA Unspecified injury of right lower leg, initial encounter: Secondary | ICD-10-CM | POA: Diagnosis not present

## 2017-07-19 DIAGNOSIS — S3992XA Unspecified injury of lower back, initial encounter: Secondary | ICD-10-CM | POA: Diagnosis not present

## 2017-07-19 DIAGNOSIS — R51 Headache: Secondary | ICD-10-CM | POA: Diagnosis not present

## 2017-07-19 DIAGNOSIS — S199XXA Unspecified injury of neck, initial encounter: Secondary | ICD-10-CM | POA: Diagnosis not present

## 2017-07-19 DIAGNOSIS — S299XXA Unspecified injury of thorax, initial encounter: Secondary | ICD-10-CM | POA: Diagnosis not present

## 2017-07-19 DIAGNOSIS — S8992XA Unspecified injury of left lower leg, initial encounter: Secondary | ICD-10-CM | POA: Diagnosis not present

## 2017-07-19 DIAGNOSIS — E041 Nontoxic single thyroid nodule: Secondary | ICD-10-CM | POA: Diagnosis not present

## 2017-08-04 DIAGNOSIS — M549 Dorsalgia, unspecified: Secondary | ICD-10-CM | POA: Diagnosis not present

## 2017-08-04 DIAGNOSIS — E041 Nontoxic single thyroid nodule: Secondary | ICD-10-CM | POA: Diagnosis not present

## 2017-08-04 DIAGNOSIS — M109 Gout, unspecified: Secondary | ICD-10-CM | POA: Diagnosis not present

## 2017-08-04 DIAGNOSIS — I10 Essential (primary) hypertension: Secondary | ICD-10-CM | POA: Diagnosis not present

## 2017-08-11 DIAGNOSIS — E042 Nontoxic multinodular goiter: Secondary | ICD-10-CM | POA: Diagnosis not present

## 2017-08-11 DIAGNOSIS — E041 Nontoxic single thyroid nodule: Secondary | ICD-10-CM | POA: Diagnosis not present

## 2017-09-05 DIAGNOSIS — J329 Chronic sinusitis, unspecified: Secondary | ICD-10-CM | POA: Diagnosis not present

## 2017-10-24 DIAGNOSIS — M25561 Pain in right knee: Secondary | ICD-10-CM | POA: Diagnosis not present

## 2017-10-26 DIAGNOSIS — M1711 Unilateral primary osteoarthritis, right knee: Secondary | ICD-10-CM | POA: Diagnosis not present

## 2017-11-14 DIAGNOSIS — E042 Nontoxic multinodular goiter: Secondary | ICD-10-CM | POA: Diagnosis not present

## 2017-11-22 DIAGNOSIS — Z760 Encounter for issue of repeat prescription: Secondary | ICD-10-CM | POA: Diagnosis not present

## 2017-11-22 DIAGNOSIS — Z0000 Encounter for general adult medical examination without abnormal findings: Secondary | ICD-10-CM | POA: Diagnosis not present

## 2017-11-22 DIAGNOSIS — I10 Essential (primary) hypertension: Secondary | ICD-10-CM | POA: Diagnosis not present

## 2017-11-22 DIAGNOSIS — E7800 Pure hypercholesterolemia, unspecified: Secondary | ICD-10-CM | POA: Diagnosis not present

## 2017-11-22 DIAGNOSIS — M25561 Pain in right knee: Secondary | ICD-10-CM | POA: Diagnosis not present

## 2017-11-28 DIAGNOSIS — E7800 Pure hypercholesterolemia, unspecified: Secondary | ICD-10-CM | POA: Diagnosis not present

## 2017-11-28 DIAGNOSIS — Z0000 Encounter for general adult medical examination without abnormal findings: Secondary | ICD-10-CM | POA: Diagnosis not present

## 2017-11-28 DIAGNOSIS — I10 Essential (primary) hypertension: Secondary | ICD-10-CM | POA: Diagnosis not present

## 2017-11-28 DIAGNOSIS — E559 Vitamin D deficiency, unspecified: Secondary | ICD-10-CM | POA: Diagnosis not present

## 2017-12-05 DIAGNOSIS — M170 Bilateral primary osteoarthritis of knee: Secondary | ICD-10-CM | POA: Diagnosis not present

## 2017-12-05 DIAGNOSIS — M2341 Loose body in knee, right knee: Secondary | ICD-10-CM | POA: Diagnosis not present

## 2017-12-05 DIAGNOSIS — M25561 Pain in right knee: Secondary | ICD-10-CM | POA: Diagnosis not present

## 2017-12-05 DIAGNOSIS — M1711 Unilateral primary osteoarthritis, right knee: Secondary | ICD-10-CM | POA: Diagnosis not present

## 2018-02-24 DIAGNOSIS — M79671 Pain in right foot: Secondary | ICD-10-CM | POA: Diagnosis not present

## 2018-02-24 DIAGNOSIS — M79672 Pain in left foot: Secondary | ICD-10-CM | POA: Diagnosis not present

## 2018-03-01 DIAGNOSIS — M7661 Achilles tendinitis, right leg: Secondary | ICD-10-CM | POA: Diagnosis not present

## 2018-03-01 DIAGNOSIS — M7662 Achilles tendinitis, left leg: Secondary | ICD-10-CM | POA: Diagnosis not present

## 2018-03-14 DIAGNOSIS — M7660 Achilles tendinitis, unspecified leg: Secondary | ICD-10-CM | POA: Diagnosis not present

## 2018-03-14 DIAGNOSIS — R350 Frequency of micturition: Secondary | ICD-10-CM | POA: Diagnosis not present

## 2018-03-14 DIAGNOSIS — Z09 Encounter for follow-up examination after completed treatment for conditions other than malignant neoplasm: Secondary | ICD-10-CM | POA: Diagnosis not present

## 2018-03-14 DIAGNOSIS — I10 Essential (primary) hypertension: Secondary | ICD-10-CM | POA: Diagnosis not present

## 2018-05-11 DIAGNOSIS — M7662 Achilles tendinitis, left leg: Secondary | ICD-10-CM | POA: Diagnosis not present

## 2018-05-11 DIAGNOSIS — M7731 Calcaneal spur, right foot: Secondary | ICD-10-CM | POA: Diagnosis not present

## 2018-05-11 DIAGNOSIS — M79672 Pain in left foot: Secondary | ICD-10-CM | POA: Diagnosis not present

## 2018-05-11 DIAGNOSIS — M79671 Pain in right foot: Secondary | ICD-10-CM | POA: Diagnosis not present

## 2018-05-11 DIAGNOSIS — M7661 Achilles tendinitis, right leg: Secondary | ICD-10-CM | POA: Diagnosis not present

## 2018-05-24 DIAGNOSIS — M7660 Achilles tendinitis, unspecified leg: Secondary | ICD-10-CM | POA: Diagnosis not present

## 2018-05-24 DIAGNOSIS — M79672 Pain in left foot: Secondary | ICD-10-CM | POA: Diagnosis not present

## 2018-06-04 DIAGNOSIS — R51 Headache: Secondary | ICD-10-CM | POA: Diagnosis not present

## 2018-06-04 DIAGNOSIS — R52 Pain, unspecified: Secondary | ICD-10-CM | POA: Diagnosis not present

## 2018-06-04 DIAGNOSIS — R05 Cough: Secondary | ICD-10-CM | POA: Diagnosis not present

## 2018-06-04 DIAGNOSIS — R0981 Nasal congestion: Secondary | ICD-10-CM | POA: Diagnosis not present

## 2018-06-04 DIAGNOSIS — R509 Fever, unspecified: Secondary | ICD-10-CM | POA: Diagnosis not present

## 2018-06-05 DIAGNOSIS — J181 Lobar pneumonia, unspecified organism: Secondary | ICD-10-CM | POA: Diagnosis not present

## 2018-06-05 DIAGNOSIS — R05 Cough: Secondary | ICD-10-CM | POA: Diagnosis not present

## 2018-06-12 DIAGNOSIS — N898 Other specified noninflammatory disorders of vagina: Secondary | ICD-10-CM | POA: Diagnosis not present

## 2018-06-12 DIAGNOSIS — J189 Pneumonia, unspecified organism: Secondary | ICD-10-CM | POA: Diagnosis not present

## 2018-06-12 DIAGNOSIS — Z09 Encounter for follow-up examination after completed treatment for conditions other than malignant neoplasm: Secondary | ICD-10-CM | POA: Diagnosis not present

## 2018-08-02 DIAGNOSIS — R52 Pain, unspecified: Secondary | ICD-10-CM | POA: Diagnosis not present

## 2018-08-02 DIAGNOSIS — Z8619 Personal history of other infectious and parasitic diseases: Secondary | ICD-10-CM | POA: Diagnosis not present

## 2018-08-02 DIAGNOSIS — R05 Cough: Secondary | ICD-10-CM | POA: Diagnosis not present

## 2018-08-02 DIAGNOSIS — R509 Fever, unspecified: Secondary | ICD-10-CM | POA: Diagnosis not present

## 2018-08-22 DIAGNOSIS — M545 Low back pain: Secondary | ICD-10-CM | POA: Diagnosis not present

## 2018-08-22 DIAGNOSIS — E559 Vitamin D deficiency, unspecified: Secondary | ICD-10-CM | POA: Diagnosis not present

## 2018-08-22 DIAGNOSIS — E7800 Pure hypercholesterolemia, unspecified: Secondary | ICD-10-CM | POA: Diagnosis not present

## 2018-08-22 DIAGNOSIS — I10 Essential (primary) hypertension: Secondary | ICD-10-CM | POA: Diagnosis not present

## 2018-10-19 DIAGNOSIS — E559 Vitamin D deficiency, unspecified: Secondary | ICD-10-CM | POA: Diagnosis not present

## 2018-10-19 DIAGNOSIS — I10 Essential (primary) hypertension: Secondary | ICD-10-CM | POA: Diagnosis not present

## 2018-10-19 DIAGNOSIS — Z760 Encounter for issue of repeat prescription: Secondary | ICD-10-CM | POA: Diagnosis not present

## 2018-10-19 DIAGNOSIS — E7800 Pure hypercholesterolemia, unspecified: Secondary | ICD-10-CM | POA: Diagnosis not present

## 2018-11-22 DIAGNOSIS — M545 Low back pain: Secondary | ICD-10-CM | POA: Diagnosis not present

## 2018-12-06 DIAGNOSIS — E7800 Pure hypercholesterolemia, unspecified: Secondary | ICD-10-CM | POA: Diagnosis not present

## 2018-12-06 DIAGNOSIS — E559 Vitamin D deficiency, unspecified: Secondary | ICD-10-CM | POA: Diagnosis not present

## 2018-12-06 DIAGNOSIS — M5430 Sciatica, unspecified side: Secondary | ICD-10-CM | POA: Diagnosis not present

## 2018-12-06 DIAGNOSIS — I10 Essential (primary) hypertension: Secondary | ICD-10-CM | POA: Diagnosis not present

## 2018-12-06 DIAGNOSIS — R8290 Unspecified abnormal findings in urine: Secondary | ICD-10-CM | POA: Diagnosis not present

## 2018-12-06 DIAGNOSIS — M545 Low back pain: Secondary | ICD-10-CM | POA: Diagnosis not present

## 2019-01-02 DIAGNOSIS — M4727 Other spondylosis with radiculopathy, lumbosacral region: Secondary | ICD-10-CM | POA: Diagnosis not present

## 2019-01-02 DIAGNOSIS — M4310 Spondylolisthesis, site unspecified: Secondary | ICD-10-CM | POA: Diagnosis not present

## 2019-01-02 DIAGNOSIS — M4807 Spinal stenosis, lumbosacral region: Secondary | ICD-10-CM | POA: Diagnosis not present

## 2019-01-02 DIAGNOSIS — G9519 Other vascular myelopathies: Secondary | ICD-10-CM | POA: Diagnosis not present

## 2019-01-02 DIAGNOSIS — M5416 Radiculopathy, lumbar region: Secondary | ICD-10-CM | POA: Diagnosis not present

## 2019-01-02 DIAGNOSIS — M5126 Other intervertebral disc displacement, lumbar region: Secondary | ICD-10-CM | POA: Diagnosis not present

## 2019-04-03 DIAGNOSIS — M25561 Pain in right knee: Secondary | ICD-10-CM | POA: Diagnosis not present

## 2019-04-03 DIAGNOSIS — M25562 Pain in left knee: Secondary | ICD-10-CM | POA: Diagnosis not present

## 2019-04-03 DIAGNOSIS — Z20828 Contact with and (suspected) exposure to other viral communicable diseases: Secondary | ICD-10-CM | POA: Diagnosis not present

## 2019-04-04 DIAGNOSIS — M25562 Pain in left knee: Secondary | ICD-10-CM | POA: Diagnosis not present

## 2019-04-04 DIAGNOSIS — M25569 Pain in unspecified knee: Secondary | ICD-10-CM | POA: Diagnosis not present

## 2019-04-04 DIAGNOSIS — Z20828 Contact with and (suspected) exposure to other viral communicable diseases: Secondary | ICD-10-CM | POA: Diagnosis not present

## 2019-04-04 DIAGNOSIS — M25561 Pain in right knee: Secondary | ICD-10-CM | POA: Diagnosis not present

## 2019-04-18 DIAGNOSIS — I10 Essential (primary) hypertension: Secondary | ICD-10-CM | POA: Diagnosis not present

## 2019-04-18 DIAGNOSIS — M25561 Pain in right knee: Secondary | ICD-10-CM | POA: Diagnosis not present

## 2019-04-18 DIAGNOSIS — E559 Vitamin D deficiency, unspecified: Secondary | ICD-10-CM | POA: Diagnosis not present

## 2019-04-18 DIAGNOSIS — Z0000 Encounter for general adult medical examination without abnormal findings: Secondary | ICD-10-CM | POA: Diagnosis not present

## 2019-04-18 DIAGNOSIS — M25562 Pain in left knee: Secondary | ICD-10-CM | POA: Diagnosis not present

## 2019-04-18 DIAGNOSIS — R7309 Other abnormal glucose: Secondary | ICD-10-CM | POA: Diagnosis not present

## 2019-04-18 DIAGNOSIS — E7800 Pure hypercholesterolemia, unspecified: Secondary | ICD-10-CM | POA: Diagnosis not present

## 2019-04-25 DIAGNOSIS — R52 Pain, unspecified: Secondary | ICD-10-CM | POA: Diagnosis not present

## 2019-04-25 DIAGNOSIS — R509 Fever, unspecified: Secondary | ICD-10-CM | POA: Diagnosis not present

## 2019-04-25 DIAGNOSIS — R432 Parageusia: Secondary | ICD-10-CM | POA: Diagnosis not present

## 2019-04-25 DIAGNOSIS — Z20828 Contact with and (suspected) exposure to other viral communicable diseases: Secondary | ICD-10-CM | POA: Diagnosis not present

## 2019-04-25 DIAGNOSIS — J069 Acute upper respiratory infection, unspecified: Secondary | ICD-10-CM | POA: Diagnosis not present

## 2019-05-01 DIAGNOSIS — U071 COVID-19: Secondary | ICD-10-CM | POA: Diagnosis not present

## 2019-05-01 DIAGNOSIS — R509 Fever, unspecified: Secondary | ICD-10-CM | POA: Diagnosis not present

## 2019-05-01 DIAGNOSIS — R5383 Other fatigue: Secondary | ICD-10-CM | POA: Diagnosis not present

## 2019-05-01 DIAGNOSIS — K047 Periapical abscess without sinus: Secondary | ICD-10-CM | POA: Diagnosis not present

## 2019-05-27 DIAGNOSIS — Z1231 Encounter for screening mammogram for malignant neoplasm of breast: Secondary | ICD-10-CM | POA: Diagnosis not present

## 2019-09-16 DIAGNOSIS — R7303 Prediabetes: Secondary | ICD-10-CM | POA: Diagnosis not present

## 2019-09-16 DIAGNOSIS — E559 Vitamin D deficiency, unspecified: Secondary | ICD-10-CM | POA: Diagnosis not present

## 2019-09-16 DIAGNOSIS — E7800 Pure hypercholesterolemia, unspecified: Secondary | ICD-10-CM | POA: Diagnosis not present

## 2019-09-16 DIAGNOSIS — M545 Low back pain: Secondary | ICD-10-CM | POA: Diagnosis not present

## 2019-09-16 DIAGNOSIS — M25562 Pain in left knee: Secondary | ICD-10-CM | POA: Diagnosis not present

## 2019-09-16 DIAGNOSIS — I10 Essential (primary) hypertension: Secondary | ICD-10-CM | POA: Diagnosis not present

## 2019-09-17 DIAGNOSIS — G8929 Other chronic pain: Secondary | ICD-10-CM | POA: Diagnosis not present

## 2019-09-17 DIAGNOSIS — M170 Bilateral primary osteoarthritis of knee: Secondary | ICD-10-CM | POA: Diagnosis not present

## 2019-09-17 DIAGNOSIS — M1712 Unilateral primary osteoarthritis, left knee: Secondary | ICD-10-CM | POA: Diagnosis not present

## 2019-10-03 DIAGNOSIS — Z09 Encounter for follow-up examination after completed treatment for conditions other than malignant neoplasm: Secondary | ICD-10-CM | POA: Diagnosis not present

## 2019-10-03 DIAGNOSIS — M5430 Sciatica, unspecified side: Secondary | ICD-10-CM | POA: Diagnosis not present

## 2019-10-03 DIAGNOSIS — M2550 Pain in unspecified joint: Secondary | ICD-10-CM | POA: Diagnosis not present

## 2019-10-03 DIAGNOSIS — M25562 Pain in left knee: Secondary | ICD-10-CM | POA: Diagnosis not present

## 2019-10-08 DIAGNOSIS — H2511 Age-related nuclear cataract, right eye: Secondary | ICD-10-CM | POA: Diagnosis not present

## 2019-10-08 DIAGNOSIS — H35013 Changes in retinal vascular appearance, bilateral: Secondary | ICD-10-CM | POA: Diagnosis not present

## 2019-10-08 DIAGNOSIS — Z961 Presence of intraocular lens: Secondary | ICD-10-CM | POA: Diagnosis not present

## 2019-10-08 DIAGNOSIS — H353131 Nonexudative age-related macular degeneration, bilateral, early dry stage: Secondary | ICD-10-CM | POA: Diagnosis not present

## 2019-10-23 ENCOUNTER — Encounter (INDEPENDENT_AMBULATORY_CARE_PROVIDER_SITE_OTHER): Payer: Self-pay | Admitting: Ophthalmology

## 2019-10-23 DIAGNOSIS — H3581 Retinal edema: Secondary | ICD-10-CM

## 2019-11-18 ENCOUNTER — Encounter (INDEPENDENT_AMBULATORY_CARE_PROVIDER_SITE_OTHER): Payer: PPO | Admitting: Ophthalmology

## 2019-11-28 NOTE — Progress Notes (Addendum)
[facility] Note  12/02/2019     CHIEF COMPLAINT Patient presents for Retina Evaluation   HISTORY OF PRESENT ILLNESS: Kerry Barker is a 74 y.o. female who presents to the clinic today for:   HPI    Retina Evaluation    In right eye.  This started 8 weeks ago.  Duration of 8 weeks.  Associated Symptoms Flashes and Floaters.  Context:  distance vision, mid-range vision and near vision.  Treatments tried include no treatments.  I, the attending physician,  performed the HPI with the patient and updated documentation appropriately.          Comments    74 y/o female pt referred by Dr. Parke Simmers on 10/08/19 for eval of PVD OD.  Symptoms were sudden onset 8 wks ago.  VA blurred OU.  C/o flashes and floaters at first OD, but symptoms have now mostly resolved.  Denies pain.  No gtts.       Last edited by Bernarda Caffey, MD on 12/02/2019  2:32 PM. (History)    pt is delayed to presentation due to 2 r/s, pt is here on the referral of Dr. Parke Simmers for concern of PVD/cataract clearance OD, pt states she saw him for flashes and floaters, she states those symptoms have resolved themselves, pt had cataract sx OS at [facility] several years ago, pt states she takes medication for high blood pressure  Referring physician: [place], [place], [place] [address],  [place] [address]  HISTORICAL INFORMATION:   Selected notes from the [facility] Referred by Dr. Parke Simmers for concern of PVD OD LEE:10/08/19 BCVA: 20/60; 20/60   CURRENT MEDICATIONS: No current outpatient medications on file. (Ophthalmic Drugs)   No current facility-administered medications for this visit. (Ophthalmic Drugs)   Current Outpatient Medications (Other)  Medication Sig  . acetaminophen (TYLENOL) 325 MG tablet Take by mouth.  Marland Kitchen amLODipine (NORVASC) 10 MG tablet Take 10 mg by mouth daily.  . Cholecalciferol 50 MCG (2000 UT) TABS Take by mouth.  . colchicine 0.6 MG tablet Take 1  tablet (0.6 mg total) by mouth daily.  . cyclobenzaprine (FLEXERIL) 10 MG tablet Take 1 tablet (10 mg total) by mouth 2 (two) times daily as needed for muscle spasms.  . cyclobenzaprine (FLEXERIL) 5 MG tablet Take 1 tablet (5 mg total) by mouth 3 (three) times daily as needed for muscle spasms.  . diclofenac (CATAFLAM) 50 MG tablet Take by mouth.  . diclofenac (VOLTAREN) 75 MG EC tablet Take by mouth.  . ergocalciferol (VITAMIN D2) 1.25 MG (50000 UT) capsule Take by mouth.  . furosemide (LASIX) 20 MG tablet Take 20 mg by mouth daily as needed.  . gabapentin (NEURONTIN) 600 MG tablet Take by mouth.  Marland Kitchen HYDROcodone-acetaminophen (NORCO) 5-325 MG per tablet Take 1-2 tablets by mouth every 6 (six) hours as needed.  Marland Kitchen ibuprofen (ADVIL,MOTRIN) 600 MG tablet Take 1 tablet (600 mg total) by mouth every 6 (six) hours as needed.  Marland Kitchen ibuprofen (ADVIL,MOTRIN) 800 MG tablet Take 1 tablet (800 mg total) by mouth 3 (three) times daily.  . meclizine (ANTIVERT) 25 MG tablet Take 25 mg by mouth daily as needed.  . meloxicam (MOBIC) 15 MG tablet Take by mouth.  . methocarbamol (ROBAXIN) 500 MG tablet Take by mouth.  . metroNIDAZOLE (FLAGYL) 500 MG tablet Take 1 tablet (500 mg total) by mouth 2 (two) times daily. One po bid x 7 days  . Multiple Vitamin (MULTIVITAMIN) tablet Take 1  tablet by mouth daily.    . nabumetone (RELAFEN) 750 MG tablet Take by mouth.  . naproxen (NAPROSYN) 500 MG tablet Take 1 tablet (500 mg total) by mouth 2 (two) times daily.  . naproxen (NAPROSYN) 500 MG tablet Take 1 tablet (500 mg total) by mouth 2 (two) times daily.  Marland Kitchen oxyCODONE-acetaminophen (PERCOCET/ROXICET) 5-325 MG per tablet Take 1-2 tablets by mouth every 6 (six) hours as needed for severe pain.  . pravastatin (PRAVACHOL) 20 MG tablet Take 20 mg by mouth at bedtime.   No current facility-administered medications for this visit. (Other)      REVIEW OF SYSTEMS: ROS    Positive for: Eyes   Negative for: Constitutional,  Gastrointestinal, Neurological, Skin, Genitourinary, Musculoskeletal, HENT, Endocrine, Cardiovascular, Respiratory, Psychiatric, Allergic/Imm, Heme/Lymph   Last edited by Matthew Folks, COA on 12/02/2019  1:42 PM. (History)       ALLERGIES Allergies  Allergen Reactions  . Penicillins Hives and Itching    PAST MEDICAL HISTORY Past Medical History:  Diagnosis Date  . Back pain   . Cataract    NS and cortical OD  . Gout   . Hypertension   . Macular degeneration    Dry OU   Past Surgical History:  Procedure Laterality Date  . ABDOMINAL HYSTERECTOMY    . CATARACT EXTRACTION Left   . EYE SURGERY Left    Cat Sx    FAMILY HISTORY History reviewed. No pertinent family history.  SOCIAL HISTORY Social History   Tobacco Use  . Smoking status: Never Smoker  . Smokeless tobacco: Never Used  Substance Use Topics  . Alcohol use: No  . Drug use: No         OPHTHALMIC EXAM:  Base Eye Exam    Visual Acuity (Snellen - Linear)      Right Left   Dist [place] 20/80 20/80   Dist ph [place] 20/60 -2 20/60 -2       Tonometry (Tonopen, 1:59 PM)      Right Left   Pressure 14 17       Pupils      Dark Light Shape React APD   Right 3 2 Round Brisk None   Left 3 2 Round Brisk None       Visual Fields (Counting fingers)      Left Right    Full Full       Extraocular Movement      Right Left    Full, Ortho Full, Ortho       Neuro/Psych    Oriented x3: Yes   Mood/Affect: Normal       Dilation    Both eyes: 1.0% Mydriacyl, 2.5% Phenylephrine @ 1:59 PM        Slit Lamp and Fundus Exam    Slit Lamp Exam      Right Left   Lids/Lashes Dermatochalasis - upper lid, mild Ptosis Dermatochalasis - upper lid, mild Ptosis   Conjunctiva/Sclera Nasal Pinguecula, mild Melanosis Nasal Pinguecula, mild Melanosis   Cornea arcus arcus   Anterior Chamber deep and clear deep and clear   Iris Round and dilated Round and dilated   Lens 3-4+ Nuclear sclerosis with brunescence, 2+  Cortical cataract PC IOL in good position   Vitreous Vitreous syneresis, Posterior vitreous detachment, Weiss ring Vitreous syneresis, mild vitreous condensations       Fundus Exam      Right Left   Disc hazy view, Pink and Sharp Pink and Sharp  C/D Ratio 0.4 0.4   Macula Flat, No heme or edema Flat, No heme or edema   Vessels Vascular attenuation, tortuousity Vascular attenuation, tortuousity   Periphery Attached    Attached           Refraction    Manifest Refraction      Sphere Cylinder Dist VA   Right +0.50 Sphere 20/60-2   Left -0.50 Sphere 20/60-2          IMAGING AND PROCEDURES  Imaging and Procedures for @TODAY @  OCT, Retina - OU - Both Eyes       Right Eye Quality was good. Central Foveal Thickness: 201. Progression has no prior data. Findings include normal foveal contour, no IRF, no SRF, outer retinal atrophy (Ellipsoid thinning centrally).   Left Eye Quality was good. Central Foveal Thickness: 192. Progression has no prior data. Findings include no IRF, no SRF, outer retinal atrophy, abnormal foveal contour, vitreomacular adhesion  (Central ORA and ellipsoid loss).   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 CME - cystoid macular edema. PED - pigment epithelial detachment. IRF - intraretinal fluid. SRF - subretinal fluid. EZ - ellipsoid zone. ERM - epiretinal membrane. ORA - outer retinal atrophy. ORT - outer retinal tubulation. SRHM - subretinal hyper-reflective material                 ASSESSMENT/PLAN:    ICD-10-CM   1. Posterior vitreous detachment of right eye  H43.811   2. Retinal edema  H35.81 OCT, Retina - OU - Both Eyes  3. Essential hypertension  I10   4. Hypertensive retinopathy of both eyes  H35.033   5. Combined forms of age-related cataract of right eye  H25.811   6. Pseudophakia   Z96.1     1. PVD / vitreous syneresis OD  - onset in April 2021 -- delayed presentation here  - symptomatic flashes/floaters now improved compared to onset in April 2021  - Discussed findings and prognosis  - No RT or RD on 360 scleral depressed exam  - Reviewed s/s of RT/RD  - Strict return precautions for any such RT/RD signs/symptoms  - clear from a retina standpoint to proceed with cataract surgery when pt and surgeon are ready  - f/u 2-3 months, DFE, OCT  2. No retinal edema on exam or OCT  - of note, OCT shows central ORA / ellipsoid loss OU -- likely contributing to decreased vision  - monitor  3,4. Hypertensive retinopathy OU  - discussed importance of tight BP control  - monitor  5. Mixed cataract OD  - The symptoms of cataract, surgical options, and treatments and risks were discussed with patient.  - discussed diagnosis and progression  - clear from a retina standpoint to proceed with cataract surgery when pt and surgeon are ready  6. Pseudophakia OS  - s/p CE/IOL [facility])  - beautiful surgery, doing well  - monitor   Ophthalmic Meds Ordered this visit:  No orders of the defined types were placed in this encounter.      Return for f/u 2-3 months, PVD OD, DFE, OCT.  There are no Patient Instructions on file for this visit.   Explained the diagnoses, plan, and follow up with the patient and they expressed understanding.  Patient expressed understanding of the importance of proper follow up care.  This document serves as a record of services personally performed by Gardiner Sleeper, MD, PhD. It was created on their behalf by Leeann Must, [place], a certified ophthalmic assistant. The creation of this record is the provider's dictation and/or activities during the visit.    Electronically signed by: Leeann Must, COA @TODAY @ 11:15 PM  Gardiner Sleeper, M.D., Ph.D. Diseases & Surgery of the Retina and Vitreous [facility]  I have reviewed the above documentation for accuracy and completeness, and I agree with the above. Gardiner Sleeper, M.D., Ph.D. 12/03/19 11:15 PM   Abbreviations: M myopia (nearsighted); A astigmatism; H hyperopia (farsighted); P presbyopia; Mrx spectacle prescription;  CTL contact lenses; OD right eye; OS left eye; OU both eyes  XT exotropia; ET esotropia; PEK punctate epithelial keratitis; PEE punctate epithelial erosions; DES dry eye syndrome; MGD meibomian gland dysfunction; ATs artificial tears; PFAT's preservative free artificial tears; [place] nuclear sclerotic cataract; PSC posterior subcapsular cataract; ERM epi-retinal membrane; PVD posterior vitreous detachment; RD retinal detachment; DM diabetes mellitus; DR diabetic retinopathy; NPDR non-proliferative diabetic retinopathy; PDR proliferative diabetic retinopathy; CSME clinically significant macular edema; DME diabetic macular edema; dbh dot blot hemorrhages; CWS cotton wool spot; POAG primary open angle glaucoma; C/D cup-to-disc ratio; HVF humphrey visual field; GVF goldmann visual field; OCT optical coherence tomography; IOP intraocular pressure; BRVO Branch retinal vein occlusion; CRVO central retinal vein occlusion; CRAO central retinal artery occlusion; BRAO branch retinal artery occlusion; RT retinal tear; SB scleral buckle; PPV pars plana vitrectomy; VH Vitreous hemorrhage; PRP panretinal laser photocoagulation; IVK intravitreal kenalog; VMT vitreomacular traction; MH Macular hole;  NVD neovascularization of the disc; NVE neovascularization elsewhere; AREDS age related eye disease study; ARMD age related macular degeneration; POAG primary open angle glaucoma; EBMD epithelial/anterior basement membrane dystrophy; ACIOL anterior chamber intraocular lens; IOL intraocular lens; PCIOL posterior chamber intraocular lens; Phaco/IOL phacoemulsification with intraocular lens placement; [place] photorefractive keratectomy; LASIK laser assisted in situ  keratomileusis; HTN hypertension; DM diabetes mellitus; COPD chronic obstructive pulmonary disease

## 2019-12-02 ENCOUNTER — Encounter (INDEPENDENT_AMBULATORY_CARE_PROVIDER_SITE_OTHER): Payer: Self-pay | Admitting: Ophthalmology

## 2019-12-02 ENCOUNTER — Other Ambulatory Visit: Payer: Self-pay

## 2019-12-02 ENCOUNTER — Ambulatory Visit (INDEPENDENT_AMBULATORY_CARE_PROVIDER_SITE_OTHER): Payer: PPO | Admitting: Ophthalmology

## 2019-12-02 DIAGNOSIS — H35033 Hypertensive retinopathy, bilateral: Secondary | ICD-10-CM

## 2019-12-02 DIAGNOSIS — Z961 Presence of intraocular lens: Secondary | ICD-10-CM | POA: Diagnosis not present

## 2019-12-02 DIAGNOSIS — I10 Essential (primary) hypertension: Secondary | ICD-10-CM

## 2019-12-02 DIAGNOSIS — H3581 Retinal edema: Secondary | ICD-10-CM | POA: Diagnosis not present

## 2019-12-02 DIAGNOSIS — H25811 Combined forms of age-related cataract, right eye: Secondary | ICD-10-CM

## 2019-12-02 DIAGNOSIS — H43811 Vitreous degeneration, right eye: Secondary | ICD-10-CM

## 2020-02-03 ENCOUNTER — Encounter (INDEPENDENT_AMBULATORY_CARE_PROVIDER_SITE_OTHER): Payer: PPO | Admitting: Ophthalmology

## 2020-02-24 ENCOUNTER — Encounter (INDEPENDENT_AMBULATORY_CARE_PROVIDER_SITE_OTHER): Payer: PPO | Admitting: Ophthalmology

## 2020-02-27 DIAGNOSIS — I10 Essential (primary) hypertension: Secondary | ICD-10-CM | POA: Diagnosis not present

## 2020-02-27 DIAGNOSIS — M5430 Sciatica, unspecified side: Secondary | ICD-10-CM | POA: Diagnosis not present

## 2020-02-27 DIAGNOSIS — E7800 Pure hypercholesterolemia, unspecified: Secondary | ICD-10-CM | POA: Diagnosis not present

## 2020-02-27 DIAGNOSIS — R7303 Prediabetes: Secondary | ICD-10-CM | POA: Diagnosis not present

## 2020-03-03 DIAGNOSIS — M1712 Unilateral primary osteoarthritis, left knee: Secondary | ICD-10-CM | POA: Diagnosis not present

## 2020-03-16 DIAGNOSIS — M545 Low back pain, unspecified: Secondary | ICD-10-CM | POA: Diagnosis not present

## 2020-03-16 DIAGNOSIS — M5430 Sciatica, unspecified side: Secondary | ICD-10-CM | POA: Diagnosis not present

## 2020-03-16 DIAGNOSIS — M546 Pain in thoracic spine: Secondary | ICD-10-CM | POA: Diagnosis not present

## 2020-03-16 DIAGNOSIS — M6281 Muscle weakness (generalized): Secondary | ICD-10-CM | POA: Diagnosis not present

## 2020-03-16 DIAGNOSIS — G8929 Other chronic pain: Secondary | ICD-10-CM | POA: Diagnosis not present

## 2020-04-14 DIAGNOSIS — M79673 Pain in unspecified foot: Secondary | ICD-10-CM | POA: Diagnosis not present

## 2020-04-14 DIAGNOSIS — G8929 Other chronic pain: Secondary | ICD-10-CM | POA: Diagnosis not present

## 2020-04-14 DIAGNOSIS — M5430 Sciatica, unspecified side: Secondary | ICD-10-CM | POA: Diagnosis not present

## 2020-04-14 DIAGNOSIS — M545 Low back pain, unspecified: Secondary | ICD-10-CM | POA: Diagnosis not present

## 2020-04-14 DIAGNOSIS — M6281 Muscle weakness (generalized): Secondary | ICD-10-CM | POA: Diagnosis not present

## 2020-04-14 DIAGNOSIS — M549 Dorsalgia, unspecified: Secondary | ICD-10-CM | POA: Diagnosis not present

## 2020-06-04 DIAGNOSIS — R7303 Prediabetes: Secondary | ICD-10-CM | POA: Diagnosis not present

## 2020-06-04 DIAGNOSIS — R55 Syncope and collapse: Secondary | ICD-10-CM | POA: Diagnosis not present

## 2020-06-04 DIAGNOSIS — R002 Palpitations: Secondary | ICD-10-CM | POA: Diagnosis not present

## 2020-06-04 DIAGNOSIS — E559 Vitamin D deficiency, unspecified: Secondary | ICD-10-CM | POA: Diagnosis not present

## 2020-06-11 DIAGNOSIS — U071 COVID-19: Secondary | ICD-10-CM | POA: Diagnosis not present

## 2020-06-22 DIAGNOSIS — R946 Abnormal results of thyroid function studies: Secondary | ICD-10-CM | POA: Diagnosis not present

## 2020-06-22 DIAGNOSIS — J349 Unspecified disorder of nose and nasal sinuses: Secondary | ICD-10-CM | POA: Diagnosis not present

## 2020-06-22 DIAGNOSIS — R519 Headache, unspecified: Secondary | ICD-10-CM | POA: Diagnosis not present

## 2020-06-22 DIAGNOSIS — E042 Nontoxic multinodular goiter: Secondary | ICD-10-CM | POA: Diagnosis not present

## 2020-06-22 DIAGNOSIS — R55 Syncope and collapse: Secondary | ICD-10-CM | POA: Diagnosis not present

## 2020-06-22 DIAGNOSIS — I6529 Occlusion and stenosis of unspecified carotid artery: Secondary | ICD-10-CM | POA: Diagnosis not present

## 2020-07-13 DIAGNOSIS — E041 Nontoxic single thyroid nodule: Secondary | ICD-10-CM | POA: Diagnosis not present

## 2020-07-13 DIAGNOSIS — Z09 Encounter for follow-up examination after completed treatment for conditions other than malignant neoplasm: Secondary | ICD-10-CM | POA: Diagnosis not present

## 2020-07-16 DIAGNOSIS — E042 Nontoxic multinodular goiter: Secondary | ICD-10-CM | POA: Diagnosis not present

## 2020-09-21 DIAGNOSIS — M25562 Pain in left knee: Secondary | ICD-10-CM | POA: Diagnosis not present

## 2020-09-21 DIAGNOSIS — M1990 Unspecified osteoarthritis, unspecified site: Secondary | ICD-10-CM | POA: Diagnosis not present

## 2020-09-21 DIAGNOSIS — E041 Nontoxic single thyroid nodule: Secondary | ICD-10-CM | POA: Diagnosis not present

## 2020-09-21 DIAGNOSIS — I10 Essential (primary) hypertension: Secondary | ICD-10-CM | POA: Diagnosis not present

## 2020-09-21 DIAGNOSIS — M25561 Pain in right knee: Secondary | ICD-10-CM | POA: Diagnosis not present

## 2020-09-21 DIAGNOSIS — E559 Vitamin D deficiency, unspecified: Secondary | ICD-10-CM | POA: Diagnosis not present

## 2020-09-21 DIAGNOSIS — R7303 Prediabetes: Secondary | ICD-10-CM | POA: Diagnosis not present

## 2020-10-07 DIAGNOSIS — Z1231 Encounter for screening mammogram for malignant neoplasm of breast: Secondary | ICD-10-CM | POA: Diagnosis not present

## 2020-10-09 DIAGNOSIS — M1990 Unspecified osteoarthritis, unspecified site: Secondary | ICD-10-CM | POA: Diagnosis not present

## 2020-10-09 DIAGNOSIS — M25562 Pain in left knee: Secondary | ICD-10-CM | POA: Diagnosis not present

## 2020-10-09 DIAGNOSIS — M25561 Pain in right knee: Secondary | ICD-10-CM | POA: Diagnosis not present

## 2021-03-25 DIAGNOSIS — E559 Vitamin D deficiency, unspecified: Secondary | ICD-10-CM | POA: Diagnosis not present

## 2021-03-25 DIAGNOSIS — Z23 Encounter for immunization: Secondary | ICD-10-CM | POA: Diagnosis not present

## 2021-03-25 DIAGNOSIS — E7800 Pure hypercholesterolemia, unspecified: Secondary | ICD-10-CM | POA: Diagnosis not present

## 2021-03-25 DIAGNOSIS — M25561 Pain in right knee: Secondary | ICD-10-CM | POA: Diagnosis not present

## 2021-03-25 DIAGNOSIS — R7303 Prediabetes: Secondary | ICD-10-CM | POA: Diagnosis not present

## 2021-03-25 DIAGNOSIS — I10 Essential (primary) hypertension: Secondary | ICD-10-CM | POA: Diagnosis not present
